# Patient Record
Sex: Female | Born: 1960 | Race: White | Hispanic: No | Marital: Single | Smoking: Never smoker
Health system: Southern US, Community
[De-identification: ages and names within clinical notes are randomized; demographics above are authoritative.]

## PROBLEM LIST (undated history)

## (undated) DIAGNOSIS — Z85820 Personal history of malignant melanoma of skin: Secondary | ICD-10-CM

## (undated) DIAGNOSIS — M1990 Unspecified osteoarthritis, unspecified site: Secondary | ICD-10-CM

## (undated) DIAGNOSIS — K5790 Diverticulosis of intestine, part unspecified, without perforation or abscess without bleeding: Secondary | ICD-10-CM

## (undated) DIAGNOSIS — I341 Nonrheumatic mitral (valve) prolapse: Secondary | ICD-10-CM

## (undated) DIAGNOSIS — K219 Gastro-esophageal reflux disease without esophagitis: Secondary | ICD-10-CM

## (undated) DIAGNOSIS — C801 Malignant (primary) neoplasm, unspecified: Secondary | ICD-10-CM

## (undated) HISTORY — DX: Personal history of malignant melanoma of skin: Z85.820

## (undated) HISTORY — DX: Nonrheumatic mitral (valve) prolapse: I34.1

## (undated) HISTORY — DX: Gastro-esophageal reflux disease without esophagitis: K21.9

## (undated) HISTORY — DX: Unspecified osteoarthritis, unspecified site: M19.90

## (undated) HISTORY — PX: OTHER SURGICAL HISTORY: SHX169

---

## 2004-05-27 ENCOUNTER — Other Ambulatory Visit: Admission: RE | Admit: 2004-05-27 | Discharge: 2004-05-27 | Payer: Self-pay | Admitting: *Deleted

## 2004-07-19 ENCOUNTER — Other Ambulatory Visit: Admission: RE | Admit: 2004-07-19 | Discharge: 2004-07-19 | Payer: Self-pay | Admitting: *Deleted

## 2005-02-04 ENCOUNTER — Encounter (INDEPENDENT_AMBULATORY_CARE_PROVIDER_SITE_OTHER): Payer: Self-pay | Admitting: *Deleted

## 2005-02-04 ENCOUNTER — Observation Stay (HOSPITAL_COMMUNITY): Admission: RE | Admit: 2005-02-04 | Discharge: 2005-02-04 | Payer: Self-pay | Admitting: *Deleted

## 2005-06-12 ENCOUNTER — Other Ambulatory Visit: Admission: RE | Admit: 2005-06-12 | Discharge: 2005-06-12 | Payer: Self-pay | Admitting: *Deleted

## 2005-06-23 ENCOUNTER — Encounter: Admission: RE | Admit: 2005-06-23 | Discharge: 2005-06-23 | Payer: Self-pay | Admitting: *Deleted

## 2006-06-15 ENCOUNTER — Other Ambulatory Visit: Admission: RE | Admit: 2006-06-15 | Discharge: 2006-06-15 | Payer: Self-pay | Admitting: *Deleted

## 2007-06-21 ENCOUNTER — Other Ambulatory Visit: Admission: RE | Admit: 2007-06-21 | Discharge: 2007-06-21 | Payer: Self-pay | Admitting: *Deleted

## 2010-05-16 ENCOUNTER — Ambulatory Visit: Payer: Self-pay | Admitting: Cardiology

## 2010-06-10 ENCOUNTER — Encounter: Payer: Self-pay | Admitting: Cardiology

## 2010-06-11 ENCOUNTER — Encounter: Payer: Self-pay | Admitting: Cardiology

## 2010-06-12 ENCOUNTER — Ambulatory Visit (INDEPENDENT_AMBULATORY_CARE_PROVIDER_SITE_OTHER): Payer: PRIVATE HEALTH INSURANCE | Admitting: Cardiology

## 2010-06-12 ENCOUNTER — Encounter: Payer: Self-pay | Admitting: Cardiology

## 2010-06-12 DIAGNOSIS — F411 Generalized anxiety disorder: Secondary | ICD-10-CM

## 2010-06-12 DIAGNOSIS — I059 Rheumatic mitral valve disease, unspecified: Secondary | ICD-10-CM

## 2010-06-12 DIAGNOSIS — F419 Anxiety disorder, unspecified: Secondary | ICD-10-CM | POA: Insufficient documentation

## 2010-06-12 DIAGNOSIS — I341 Nonrheumatic mitral (valve) prolapse: Secondary | ICD-10-CM

## 2010-06-12 DIAGNOSIS — Z780 Asymptomatic menopausal state: Secondary | ICD-10-CM | POA: Insufficient documentation

## 2010-06-12 DIAGNOSIS — R002 Palpitations: Secondary | ICD-10-CM

## 2010-06-12 NOTE — Assessment & Plan Note (Signed)
The patient has a history of mitral valve prolapse and recently has been having more palpitations.  These can occur at anytime of day or night.  They're not related to exertion.  They seem to be related to stress

## 2010-06-12 NOTE — Progress Notes (Signed)
HPI: This pleasant 50 year old Caucasian woman is seen for a six-month followup office visit.  He has a past history of palpitations and suspected mitral valve prolapse.  Recently she's been having increased palpitations.  She has a new job which was a promotion for her but it is stressful.  She feels that perhaps the job stress may be contributing to her palpitations.  She has not been getting any regular exercise.  She has also been experiencing some left-sided groin pain which manifests itself when she lies supine in bed at night.  She will discuss this symptom with her gynecologist At her visit later this month.  Current Outpatient Prescriptions  Medication Sig Dispense Refill  . ALPRAZolam (XANAX) 0.25 MG tablet Take 0.25 mg by mouth at bedtime as needed. 1/2 TO 1 TABLET PRN      . Ascorbic Acid (VITAMIN C) 500 MG tablet Take 500 mg by mouth daily.        Marland Kitchen aspirin 81 MG tablet Take 81 mg by mouth daily.        . Calcium Carbonate-Vitamin D (CALTRATE 600+D PO) Take by mouth daily.        . Cholecalciferol (VITAMIN D) 2000 UNITS CAPS Take 2,000 Units by mouth daily.        Marland Kitchen estradiol (VIVELLE-DOT) 0.05 MG/24HR Place 1 patch onto the skin once a week.        . Multiple Vitamins-Minerals (CENTRUM PO) Take by mouth daily.          Allergies  Allergen Reactions  . Desyrel (Trazodone Hcl)   . Erythromycin     Patient Active Problem List  Diagnoses  . Mitral valve prolapse  . Palpitations  . Anxiety  . Postmenopausal state    History  Smoking status  . Never Smoker   Smokeless tobacco  . Not on file    History  Alcohol Use     No family history on file.  Review of Systems: The patient denies any heat or cold intolerance.  No weight gain or weight loss.  The patient denies headaches or blurry vision.  There is no cough or sputum production.  The patient denies dizziness.  There is no hematuria or hematochezia.  The patient denies any muscle aches or arthritis.  The patient  denies any rash.  The patient denies frequent falling or instability.  There is no history of depression or anxiety.  All other systems were reviewed and are negative.   Physical Exam: Filed Vitals:   06/12/10 1606  BP: 130/78  Pulse: 78  Her weight is 123, down 7 pounds.  She attributes stress and lack of exercise to the weight loss.  She did visit with the dietitian and is trying to adhere to a heart healthy diet.  The general appearance reveals a pleasant middle-aged woman in no distress.Pupils equal and reactive.   Extraocular Movements are full.  There is no scleral icterus.  The mouth and pharynx are normal.  The neck is supple.  The carotids reveal no bruits.  The jugular venous pressure is normal.  The thyroid is not enlarged.  There is no lymphadenopathy.The chest is clear to percussion and auscultation. There are no rales or rhonchi. Expansion of the chest is symmetrical.The precordium is quiet.  The first heart sound is normal.  The second heart sound is physiologically split.  There is no murmur gallop rub.There is a sharp midsystolic click at apex.  There is no abnormal lift or heave.The abdomen is  soft and nontender. Bowel sounds are normal. The liver and spleen are not enlarged. There Are no abdominal masses. There are no bruits.Normal extremity without phlebitis or edema    Assessment / Plan: Trial of alprazolam 0.25 mg one half or one daily p.r.n. For anxiety and palpitations.  Recheck in 6 months for followup office visit and get fasting lipid panel and chemistries then

## 2010-06-12 NOTE — Assessment & Plan Note (Signed)
Her last echocardiogram was in 2006 and at that time showed normal left ventricular systolic and diastolic function and trace mitral regurgitation but no definite prolapse identified in this patient with a loud midsystolic click.  She had a nuclear stress test for atypical chest pain on 04/23/07 which showed an ejection fraction of 65% and no ischemia and was normal.

## 2010-07-05 ENCOUNTER — Other Ambulatory Visit: Payer: Self-pay | Admitting: Gynecology

## 2010-07-16 ENCOUNTER — Other Ambulatory Visit: Payer: PRIVATE HEALTH INSURANCE | Admitting: *Deleted

## 2010-07-16 ENCOUNTER — Telehealth: Payer: Self-pay | Admitting: Cardiology

## 2010-07-16 DIAGNOSIS — R55 Syncope and collapse: Secondary | ICD-10-CM

## 2010-07-16 NOTE — Telephone Encounter (Signed)
Per mother patient was at work and had syncopal episode and they took her to ED there (not a [facility]).  Emergency department wanted her to have a fasting blood sugar level (hers was over 200) and thyroid panel.  Scheduled for tomorrow.

## 2010-07-16 NOTE — Telephone Encounter (Signed)
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

## 2010-07-17 ENCOUNTER — Other Ambulatory Visit (INDEPENDENT_AMBULATORY_CARE_PROVIDER_SITE_OTHER): Payer: PRIVATE HEALTH INSURANCE | Admitting: *Deleted

## 2010-07-17 DIAGNOSIS — R55 Syncope and collapse: Secondary | ICD-10-CM

## 2010-07-17 LAB — BASIC METABOLIC PANEL
BUN: 16 mg/dL (ref 6–23)
CO2: 29 mEq/L (ref 19–32)
Creatinine, Ser: 0.7 mg/dL (ref 0.4–1.2)
GFR: 100.01 mL/min (ref >60.00)

## 2010-07-24 ENCOUNTER — Telehealth: Payer: Self-pay | Admitting: Cardiology

## 2010-07-24 NOTE — Telephone Encounter (Signed)
Advised labs ok.  Offered follow up appointment, however she will follow up with doctors at work since syncope happened there has to anyway.

## 2010-07-24 NOTE — Telephone Encounter (Signed)
Called returning your phone call. Please call back.

## 2010-07-25 NOTE — Telephone Encounter (Signed)
Agree with plan 

## 2010-07-26 NOTE — H&P (Signed)
NAME:  Catherine Allison, Catherine Allison NO.:  [id number]   MEDICAL RECORD NO.:  [id number]          PATIENT TYPE:  INP   LOCATION:  NA                            FACILITY:  [facility]   PHYSICIAN:  Almedia Balls. Fore, M.D.   DATE OF BIRTH:  21-Jun-1960   DATE OF ADMISSION:  02/04/2005  DATE OF DISCHARGE:                                HISTORY & PHYSICAL   CHIEF COMPLAINT:  Recurrent left ovarian cyst.   HISTORY:  Patient is a 50 year old gravida 0 whose last menstrual period was  January 02, 2005.  She had been followed for a large left ovarian cyst in  the spring of 2006 and underwent hysteroscopy, D&C, and laparoscopy with  left ovarian cystectomy on Jul 19, 2004 with findings of mucinous  cystadenoma as well as benign changes in the endometrium.  She developed  pain in August of 2006 and underwent ultrasound showing recurrence of the  cyst on her left ovary so that she was reultrasounded in early November of  2006.  Consultation with Dr. 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  The patient has been counseled regarding this and the surgery  has been discussed with her fully to include procedure in full, risks of  anesthesia, injury to uterus, tubes, ovaries, bowel, bladder, blood vessels,  ureters, postoperative hemorrhage, infection, recuperation period.  She  fully understands all these considerations and wishes to proceed on February 04, 2005.   PAST MEDICAL HISTORY:  Surgery as noted above.  She has been taking no  medications and is allergic to no medications.   FAMILY HISTORY:  Father with colon cancer and brother with retinal blastoma.  Father with diabetes and cardiovascular disease.   REVIEW OF SYSTEMS:  HEENT:  Negative.  CARDIORESPIRATORY:  History of mitral  valve prolapse without problems.  GASTROINTESTINAL:   Negative.  GENITOURINARY:  As noted above.  NEUROMUSCULAR:  Negative.   PHYSICAL EXAMINATION:  VITAL SIGNS:  Height 5 feet 6-3/4 inches, weight 121  pounds, blood pressure 116/62, pulse 80, respirations 18.  GENERAL:  Well-developed white female in no acute distress.  HEENT:  Within normal limits.  NECK:  Supple without masses, adenopathy, or bruit.  HEART:  Regular rate and rhythm without murmur.  LUNGS:  Clear to P&A.  BREASTS:  Sitting and lying without mass.  Axilla negative.  ABDOMEN:  Soft without mass.  Slight tenderness in the left lower quadrant.  PELVIC:  External genitalia, Bartholin's, urethral, and Skene's glands  within normal limits.  Vagina is clean.  Cervix is slightly inflamed.  Uterus is mid, posterior, irregular, and approximately 10-[redacted] weeks  gestational size.  There is tenderness and slight fullness to the left in  the adnexal area.  Right adnexa within normal limits.  Some tenderness in  the posterior cul-de-sac.  EXTREMITIES:  Within normal limits.  CENTRAL NERVOUS SYSTEM:  Grossly intact.  SKIN:  Without suspicious lesions.  IMPRESSION:  Recurrent left ovarian cyst.   DISPOSITION:  As noted above.  Of note is that a Pap smear was normal in  March of 2006.           ______________________________  Almedia Balls. Randell Patient, M.D.     SRF/MEDQ  D:  01/27/2005  T:  01/27/2005  Job:  [id number]

## 2010-07-26 NOTE — Op Note (Signed)
NAME:  Catherine Allison, Catherine Allison NO.:  [id number]   MEDICAL RECORD NO.:  [id number]          PATIENT TYPE:  INP   LOCATION:  9309                          [facility]ACILITY:  [facility]   PHYSICIAN:  Almedia Balls. Fore, M.D.   DATE OF BIRTH:  1960/05/29   DATE OF PROCEDURE:  02/04/2005  DATE OF DISCHARGE:                                 OPERATIVE REPORT   PREOPERATIVE DIAGNOSIS:  Recurrent left ovarian cyst.   POSTOPERATIVE DIAGNOSIS:  Recurrent left ovarian cyst, pending pathology.   OPERATION:  Laparoscopy with left salpingo-oophorectomy, pelvic washings.   ANESTHESIA:  General orotracheal.   OPERATOR:  Almedia Balls. Randell Patient, M.D.   INDICATION FOR SURGERY:  The patient is a 50 year old who had undergone left  ovarian cystectomy in May 2006 with findings of mucinous cystadenoma and  benign changes.  She has had a recurrence of this cyst on her left ovary,  and after consultation with the GYN oncology group from [facility], it was  felt that she should have a left salpingo-oophorectomy to remove this cyst  and prevent possible malignant change.  She has been fully counseled as to  the need for this procedure and the nature of the procedure to be performed  to include risks of anesthesia and injury to uterus, tubes, ovaries, bowel,  blood vessels, ureters, postoperative hemorrhage, infection, recuperation.  She fully understands all these considerations and has signed informed  consent to proceed on February 04, 2005.   OPERATIVE FINDINGS:  On entry into the abdomen using the laparoscope, the  lower liver edge, gallbladder, spleen, area of the appendix were normal to  visualization.  In the pelvis the right tube and ovary appeared normal with  a corpus luteum present on the right ovary.  The uterus appeared normal.  The left ovary had a cyst present approximately 3-4 cm in greatest  dimensions.   PROCEDURE:  With the patient under general anesthesia, prepped and draped in  the usual sterile  fashion, a speculum was placed in the vagina.  The cervix  was grasped with a single-tooth tenaculum and an acorn cannula was placed as  well.  A Foley catheter was left to straight drainage.  The patient was then  prepared and draped for a laparoscopic procedure.  A incision was made in  the lower pole of the umbilicus with insertion of the Veress cannula and  insufflation of 3 L of carbon dioxide.  A reusable 10/12 trocar for the  operative scope and the scope itself were inserted through the incision.  A  reusable 5 mm probe was inserted through a stab wound just above the  symphysis pubis.  The above-noted findings were visualized.  Bipolar  electrocoagulation was used after the left adnexal structures were retracted  well away from the lateral peritoneal surface to render the  infundibulopelvic ligament and underlying supporting structures to the left  tube and ovary hemostatic.  The utero-ovarian anastomosis was likewise  rendered hemostatic with bipolar electrocoagulation.  These areas were  sequentially cut free with excision of the left tube and ovary from the left  cornual area and left peritoneal surface area.  An EndoCatch bag was then  placed after extending the lower abdominal incision and placing an 11 mm  disposable trocar through this site.  The left tube and ovary were placed in  the bag and brought up through the lower abdominal incision after extending  this somewhat.  The ovary and tube were removed intact.  The fascia was then  reapproximated with continuous suture of 0 Vicryl.  After noting that  hemostasis had been maintained in the surgical site and that sponge and  instrument counts were correct, the instruments were removed from the  peritoneal cavity and gas was allowed to fully escape.  The incisions were  closed with the above-noted fascial suture and a subcuticular suture of 3-0  plain catgut.  Estimated blood loss less than 25 mL.  The patient was taken  to  the recovery room in good condition with clear urine in the Foley  catheter tubing.   FOLLOW-UP CARE:  She will be discharged following recovery if vital signs  are stable and sent home.  She was asked to return to the office in two  weeks for follow-up and to call if heavy bleeding, pain or unexplained fever  should ensure.  She was given a prescription for Darvocet-N 100 generic #20  to be taken one-half to two q.6h. p.r.n. pain, and Macrobid #8 to be taken  two stat and one t.i.d.           ______________________________  Almedia Balls. Randell Patient, M.D.     SRF/MEDQ  D:  02/04/2005  T:  02/04/2005  Job:  [id number]

## 2010-08-14 ENCOUNTER — Telehealth: Payer: Self-pay | Admitting: Cardiology

## 2010-08-14 ENCOUNTER — Ambulatory Visit (INDEPENDENT_AMBULATORY_CARE_PROVIDER_SITE_OTHER): Payer: PRIVATE HEALTH INSURANCE | Admitting: Cardiology

## 2010-08-14 ENCOUNTER — Encounter: Payer: Self-pay | Admitting: Cardiology

## 2010-08-14 DIAGNOSIS — R55 Syncope and collapse: Secondary | ICD-10-CM

## 2010-08-14 DIAGNOSIS — R42 Dizziness and giddiness: Secondary | ICD-10-CM

## 2010-08-14 LAB — CBC WITH DIFFERENTIAL/PLATELET
Basophils Relative: 0.5 % (ref 0.0–3.0)
Eosinophils Absolute: 0.1 10*3/uL (ref 0.0–0.7)
Lymphs Abs: 1.2 10*3/uL (ref 0.7–4.0)
MCHC: 34.5 g/dL (ref 30.0–36.0)
MCV: 91.6 fl (ref 78.0–100.0)
Monocytes Absolute: 0.5 10*3/uL (ref 0.1–1.0)
Monocytes Relative: 5.4 % (ref 3.0–12.0)
RDW: 13 % (ref 11.5–14.6)

## 2010-08-14 LAB — HEPATIC FUNCTION PANEL
ALT: 16 U/L (ref 0–35)
Albumin: 4.4 g/dL (ref 3.5–5.2)
Alkaline Phosphatase: 46 U/L (ref 39–117)
Bilirubin, Direct: 0.1 mg/dL (ref 0.0–0.3)
Total Protein: 6.9 g/dL (ref 6.0–8.3)

## 2010-08-14 LAB — BASIC METABOLIC PANEL
BUN: 14 mg/dL (ref 6–23)
Creatinine, Ser: 0.5 mg/dL (ref 0.4–1.2)
Glucose, Bld: 86 mg/dL (ref 70–99)

## 2010-08-14 NOTE — Progress Notes (Signed)
Catherine Allison Date of Birth:  [birth year] [facility] [address]   [address] [place], [place]  [address] [phone]           Fax   [phone]  History of Present Illness: This pleasant 50 year old woman is seen for a evaluation of syncope.  She has had one episode of frank syncope occurring without warning in 2 subsequent episodes of severe dizziness.  She does not have any history of previous similar problems.  She does have a history of suspected mitral valve prolapse although her echocardiogram in March of 2006 showed trace mitral regurgitation but no definite prolapse.  The patient had a nuclear stress test 04/23/07 and exercise well on the Bruce protocol and had no evidence of ischemia and her ejection fraction was 65%.  Current Outpatient Prescriptions  Medication Sig Dispense Refill  . ALPRAZolam (XANAX) 0.25 MG tablet Take 0.25 mg by mouth at bedtime as needed. 1/2 TO 1 TABLET PRN      . Ascorbic Acid (VITAMIN C) 500 MG tablet Take 500 mg by mouth daily.        Marland Kitchen aspirin 81 MG tablet Take 81 mg by mouth daily.        . Calcium Carbonate-Vitamin D (CALTRATE 600+D PO) Take by mouth daily.        . Cholecalciferol (VITAMIN D) 2000 UNITS CAPS Take 2,000 Units by mouth daily.        Marland Kitchen estradiol (VIVELLE-DOT) 0.05 MG/24HR Place 1 patch onto the skin once a week.        . Multiple Vitamins-Minerals (CENTRUM PO) Take by mouth daily.          Allergies  Allergen Reactions  . Desyrel (Trazodone Hcl)   . Erythromycin     Patient Active Problem List  Diagnoses  . Mitral valve prolapse  . Palpitations  . Anxiety  . Postmenopausal state  . Syncope    History  Smoking status  . Never Smoker   Smokeless tobacco  . Not on file    History  Alcohol Use     No family history on file.  Review of Systems: Constitutional: no fever chills diaphoresis or fatigue or change in weight.  Head and neck: no hearing loss, no epistaxis, no photophobia or  visual disturbance. Respiratory: No cough, shortness of breath or wheezing. Cardiovascular: No chest pain peripheral edema, palpitations. Gastrointestinal: No abdominal distention, no abdominal pain, no change in bowel habits hematochezia or melena. Genitourinary: No dysuria, no frequency, no urgency, no nocturia. Musculoskeletal:No arthralgias, no back pain, no gait disturbance or myalgias. Neurological: No dizziness, no headaches, no numbness, no seizures, no syncope, no weakness, no tremors. Hematologic: No lymphadenopathy, no easy bruising. Psychiatric: No confusion, no hallucinations, no sleep disturbance.    Physical Exam: Filed Vitals:   08/14/10 1102  BP: 110/60  Pulse: 64  The general appearance reveals a thin middle-aged woman in no distress.Pupils equal and reactive.   Extraocular Movements are full.  There is no scleral icterus.  The mouth and pharynx are normal.  The neck is supple.  The carotids reveal no bruits.  The jugular venous pressure is normal.  The thyroid is not enlarged.  There is no lymphadenopathy.The chest is clear to percussion and auscultation. There are no rales or rhonchi. Expansion of the chest is symmetrical.  Heart reveals midsystolic click.The abdomen is soft and nontender. Bowel sounds are normal. The liver and spleen are not enlarged. There Are no abdominal  masses. There are no bruits.The pedal pulses are good.  There is no phlebitis or edema.  There is no cyanosis or clubbing.Strength is normal and symmetrical in all extremities.  There is no lateralizing weakness.  There are no sensory deficits.The skin is warm and dry.  There is no rash.  She does not show any orthostatic drop in blood pressure when standing.  EKG today shows normal sinus rhythm and normal QT interval and is within normal limits  Assessment / Plan: Syncope of uncertain etiology.  We will get baseline blood work, up date her two-dimensional echocardiogram, and have her wear a event  monitor.  This may prove to be dizziness secondary to vertigo.  She will get some over-the-counter Dramamine.  I've also encouraged her to increase her dietary salt intake to maintain a slightly higher blood pressure to avoid orthostatic symptoms.  Keep regular appointment in August for followup.

## 2010-08-14 NOTE — Assessment & Plan Note (Signed)
This pleasant 50 year old woman is seen for evaluation of syncope.  Several weeks ago she was at work and had syncope without warning.  She woke up on the floor with her coworkers gathered around her.  They took her to employee health at [facility] where she was checked and they could find nothing and no further testing was done.  She did not have a CT scan of the head.  Since then she has had 2 further episodes of severe dizziness without frank syncope.  She had one episode last evening associated with nausea.  There was no sense of the room spinning but she was extremely dizzy and had to crawl from her chair to get to her bed.  She has a past history of palpitations and suspected mitral valve prolapse.  She has a tendency toward low blood pressure.  Recently she has been cutting back on her salt intake.

## 2010-08-16 ENCOUNTER — Telehealth: Payer: Self-pay | Admitting: *Deleted

## 2010-08-16 NOTE — Telephone Encounter (Signed)
Advised mother of lab results  

## 2010-08-16 NOTE — Progress Notes (Signed)
Advised mother

## 2010-08-16 NOTE — Telephone Encounter (Signed)
Message copied by Burnell Blanks on Fri Aug 16, 2010 10:30 AM ------      Message from: Cassell Clement      Created: Wed Aug 14, 2010  5:02 PM       The blood work is normal.  Please report.

## 2010-08-16 NOTE — Progress Notes (Signed)
Left message

## 2010-08-21 ENCOUNTER — Ambulatory Visit (HOSPITAL_COMMUNITY): Payer: PRIVATE HEALTH INSURANCE | Attending: Cardiology | Admitting: Radiology

## 2010-08-21 ENCOUNTER — Telehealth: Payer: Self-pay | Admitting: *Deleted

## 2010-08-21 ENCOUNTER — Telehealth: Payer: Self-pay | Admitting: Cardiology

## 2010-08-21 DIAGNOSIS — I059 Rheumatic mitral valve disease, unspecified: Secondary | ICD-10-CM | POA: Insufficient documentation

## 2010-08-21 DIAGNOSIS — R42 Dizziness and giddiness: Secondary | ICD-10-CM | POA: Insufficient documentation

## 2010-08-21 DIAGNOSIS — R002 Palpitations: Secondary | ICD-10-CM | POA: Insufficient documentation

## 2010-08-21 DIAGNOSIS — R55 Syncope and collapse: Secondary | ICD-10-CM

## 2010-08-21 NOTE — Progress Notes (Signed)
Advised 

## 2010-08-21 NOTE — Telephone Encounter (Signed)
Message copied by Karle Plumber on Wed Aug 21, 2010  2:50 PM ------      Message from: Cassell Clement      Created: Wed Aug 21, 2010  1:03 PM       Please report.  The echocardiogram is good.The left ventricle is normal.  Mitral valve shows only trivial mitral regurgitation.  No cause for her syncopal episodes was seen on echo

## 2010-08-21 NOTE — Telephone Encounter (Signed)
Left call back number with pt mother. Pt is currently at work. And will call us back regarding echo results.

## 2010-08-21 NOTE — Telephone Encounter (Signed)
Message copied by Burnell Blanks on Wed Aug 21, 2010  3:08 PM ------      Message from: Cassell Clement      Created: Wed Aug 21, 2010  1:03 PM       Please report.  The echocardiogram is good.The left ventricle is normal.  Mitral valve shows only trivial mitral regurgitation.  No cause for her syncopal episodes was seen on echo

## 2010-08-21 NOTE — Telephone Encounter (Signed)
Advised of echo results 

## 2010-08-21 NOTE — Telephone Encounter (Signed)
Message copied by Burnell Blanks on Wed Aug 21, 2010  3:07 PM ------      Message from: Cassell Clement      Created: Wed Aug 21, 2010  1:03 PM       Please report.  The echocardiogram is good.The left ventricle is normal.  Mitral valve shows only trivial mitral regurgitation.  No cause for her syncopal episodes was seen on echo

## 2010-09-12 DIAGNOSIS — R55 Syncope and collapse: Secondary | ICD-10-CM

## 2010-09-26 ENCOUNTER — Encounter: Payer: Self-pay | Admitting: Cardiology

## 2010-09-30 ENCOUNTER — Encounter: Payer: Self-pay | Admitting: Cardiology

## 2010-10-08 NOTE — Telephone Encounter (Signed)
No note necessary for this encounter. °

## 2010-10-17 ENCOUNTER — Encounter: Payer: Self-pay | Admitting: Cardiology

## 2010-10-18 ENCOUNTER — Encounter: Payer: Self-pay | Admitting: Cardiology

## 2010-10-18 ENCOUNTER — Ambulatory Visit (INDEPENDENT_AMBULATORY_CARE_PROVIDER_SITE_OTHER): Payer: PRIVATE HEALTH INSURANCE | Admitting: Cardiology

## 2010-10-18 DIAGNOSIS — I059 Rheumatic mitral valve disease, unspecified: Secondary | ICD-10-CM

## 2010-10-18 DIAGNOSIS — F411 Generalized anxiety disorder: Secondary | ICD-10-CM

## 2010-10-18 DIAGNOSIS — I341 Nonrheumatic mitral (valve) prolapse: Secondary | ICD-10-CM

## 2010-10-18 DIAGNOSIS — F419 Anxiety disorder, unspecified: Secondary | ICD-10-CM

## 2010-10-18 NOTE — Assessment & Plan Note (Signed)
Patient does admit to some job-related stress even though she enjoys her work.  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  The job has a lot of responsibility and she takes her job very seriously.

## 2010-10-18 NOTE — Assessment & Plan Note (Signed)
The patient has not had any recurrent chest pain or other symptoms related to her mitral valve prolapse.

## 2010-10-18 NOTE — Progress Notes (Signed)
Catherine Allison Date of Birth:  07/11/1960 [facility] / [facility] [address]   [address] [place], [place]  [address] [phone]           Fax   [phone]  HPI: This pleasant 50 year old woman is seen for a followup office visit.  He has a past history of mitral valve prolapse.  She has not been expressing any chest pain or shortness of breath.  She does not have any history of ischemic heart disease.  She had a normal nuclear stress testing 04/23/07.  Her last echocardiogram was 05/17/04 which showed trace mitral regurgitation with no definite mitral valve prolapse.  During this past year she had an episode of syncope while at work.  This was evaluated withAnd updated two-dimensional echocardiogram on 08/21/10 which was unremarkable.  She also had an event monitor which did not show any significant arrhythmias she's had no recurrence of the syncope.  Current Outpatient Prescriptions  Medication Sig Dispense Refill  . ALPRAZolam (XANAX) 0.25 MG tablet Take 0.25 mg by mouth at bedtime as needed. 1/2 TO 1 TABLET PRN      . Ascorbic Acid (VITAMIN C) 500 MG tablet Take 500 mg by mouth daily.        Marland Kitchen aspirin 81 MG tablet Take 81 mg by mouth daily.        . Calcium Carbonate-Vitamin D (CALTRATE 600+D PO) Take by mouth daily.        . Cholecalciferol (VITAMIN D) 2000 UNITS CAPS Take 2,000 Units by mouth daily.        Marland Kitchen estradiol (VIVELLE-DOT) 0.05 MG/24HR Place 1 patch onto the skin once a week.        . Multiple Vitamins-Minerals (CENTRUM PO) Take by mouth daily.        . Tacrolimus (PROGRAF PO) Take by mouth daily.          Allergies  Allergen Reactions  . Desyrel (Trazodone Hcl)   . Erythromycin     Patient Active Problem List  Diagnoses  . Mitral valve prolapse  . Palpitations  . Anxiety  . Postmenopausal state  . Syncope    History  Smoking status  . Never Smoker   Smokeless tobacco  . Not on file    History  Alcohol Use     No family history on  file.  Review of Systems: The patient denies any heat or cold intolerance.  No weight gain or weight loss.  The patient denies headaches or blurry vision.  There is no cough or sputum production.  The patient denies dizziness.  There is no hematuria or hematochezia.  The patient denies any muscle aches or arthritis.  The patient denies any rash.  The patient denies frequent falling or instability.  There is no history of depression or anxiety.  All other systems were reviewed and are negative.   Physical Exam: Filed Vitals:   10/18/10 1558  BP: 120/80  Pulse: 68  The general appearance feels a well-developed thin woman in no acute distress thePupils equal and reactive.   Extraocular Movements are full.  There is no scleral icterus.  The mouth and pharynx are normal.  The neck is supple.  The carotids reveal no bruits.  The jugular venous pressure is normal.  The thyroid is not enlarged.  There is no lymphadenopathy.  The chest is clear to percussion and auscultation. There are no rales or rhonchi. Expansion of the chest is symmetrical.   heart reveals a  soft midsystolic click.The abdomen is soft and nontender. Bowel sounds are normal. The liver and spleen are not enlarged. There Are no abdominal masses. There are no bruits.    Normal extremityStrength is normal and symmetrical in all extremities.  There is no lateralizing weakness.  There are no sensory deficits.  The skin is warm and dry.  There is no rash.      Assessment / Plan: Continue same medication and be rechecked in 6 months for a followup office visit.

## 2011-05-11 ENCOUNTER — Telehealth: Payer: Self-pay | Admitting: Physician Assistant

## 2011-05-11 NOTE — Telephone Encounter (Signed)
Pt called requesting antibiotic for possible L eye infection. She has h/o prior R eye stye, which was treated by Dr Patty Sermons. I indicated to her that I was not comfortable calling in a prescription for her, and that she should be seen and evaluated. I suggested a walk in clinic today. In the meanwhile, I recommended she use warm compresses. She understood my reluctance to treat her with an antibiotic for a non cardiac issue, and appreciated the return call.

## 2011-07-07 ENCOUNTER — Other Ambulatory Visit: Payer: Self-pay | Admitting: Gynecology

## 2011-07-22 ENCOUNTER — Ambulatory Visit (INDEPENDENT_AMBULATORY_CARE_PROVIDER_SITE_OTHER): Payer: PRIVATE HEALTH INSURANCE | Admitting: Cardiology

## 2011-07-22 ENCOUNTER — Encounter: Payer: Self-pay | Admitting: Cardiology

## 2011-07-22 VITALS — BP 127/74 | HR 74 | Ht 66.0 in | Wt 123.0 lb

## 2011-07-22 DIAGNOSIS — I341 Nonrheumatic mitral (valve) prolapse: Secondary | ICD-10-CM

## 2011-07-22 DIAGNOSIS — F411 Generalized anxiety disorder: Secondary | ICD-10-CM

## 2011-07-22 DIAGNOSIS — R55 Syncope and collapse: Secondary | ICD-10-CM

## 2011-07-22 DIAGNOSIS — I059 Rheumatic mitral valve disease, unspecified: Secondary | ICD-10-CM

## 2011-07-22 DIAGNOSIS — F419 Anxiety disorder, unspecified: Secondary | ICD-10-CM

## 2011-07-22 MED ORDER — ALPRAZOLAM 0.25 MG PO TABS: 0.2500 mg | ORAL_TABLET | Freq: Every evening | ORAL | Status: AC | PRN

## 2011-07-22 NOTE — Assessment & Plan Note (Signed)
She has had no further syncopal episodes.  She relates that at a recent dental office her blood pressure was in the range of 95 systolic.  She is not on any medications which would be lowering her blood pressure.  She will need to try to boost her blood pressure by assuring adequate intake of water and salt

## 2011-07-22 NOTE — Progress Notes (Signed)
Catherine Allison Date of Birth:  [birth year] [facility] [address] [place], [place]  [address] [phone]  Fax   [phone]  HPI: This pleasant 51 year old woman is seen for a six-month followup office visit.  She has a past history of mitral valve prolapse.  She also has a past history of syncope.  She has not had any syncopal episodes in almost a year.  He does have a history of suspected mitral valve prolapse although her echocardiogram in 2006 showed only trace mitral regurgitation but no definite prolapse.  The patient does not have any history of ischemic heart disease.  She had a normal nuclear stress test 04/23/07 on the Bruce protocol had no evidence of ischemia and her ejection fraction was 65%  Current Outpatient Prescriptions  Medication Sig Dispense Refill  . Ascorbic Acid (VITAMIN C) 500 MG tablet Take 500 mg by mouth daily.        Marland Kitchen aspirin 81 MG tablet Take 81 mg by mouth daily.        . Calcium Carbonate-Vitamin D (CALTRATE 600+D PO) Take by mouth daily.        . Cholecalciferol (VITAMIN D) 2000 UNITS CAPS Take 2,000 Units by mouth daily.        Marland Kitchen DOXYCYCLINE, ROSACEA, PO Take by mouth daily.      Marland Kitchen estradiol (VIVELLE-DOT) 0.05 MG/24HR Place 1 patch onto the skin once a week.        . Multiple Vitamins-Minerals (CENTRUM PO) Take by mouth daily.        . Tacrolimus (PROGRAF PO) Take by mouth daily.          Allergies  Allergen Reactions  . Desyrel (Trazodone Hcl)   . Erythromycin     Patient Active Problem List  Diagnoses  . Mitral valve prolapse  . Palpitations  . Anxiety  . Postmenopausal state  . Syncope    History  Smoking status  . Never Smoker   Smokeless tobacco  . Not on file    History  Alcohol Use     History reviewed. No pertinent family history.  Review of Systems: The patient denies any heat or cold intolerance.  No weight gain or weight loss.  The patient denies headaches or blurry vision.  There is no cough  or sputum production.  The patient denies dizziness.  There is no hematuria or hematochezia.  The patient denies any muscle aches or arthritis.  The patient denies any rash.  The patient denies frequent falling or instability.  There is no history of depression or anxiety.  All other systems were reviewed and are negative.   Physical Exam: Filed Vitals:   07/22/11 1559  BP: 127/74  Pulse: 74   the general appearance reveals a well-developed well-nourished woman in no distress.The head and neck exam reveals pupils equal and reactive.  Extraocular movements are full.  There is no scleral icterus.  The mouth and pharynx are normal.  The neck is supple.  The carotids reveal no bruits.  The jugular venous pressure is normal.  The  thyroid is not enlarged.  There is no lymphadenopathy.  The chest is clear to percussion and auscultation.  There are no rales or rhonchi.  Expansion of the chest is symmetrical.  The precordium is quiet.  The first heart sound is normal.  The second heart sound is physiologically split.  There is no murmur gallop rub.  There is a faint apical click  There is no  abnormal lift or heave.  The abdomen is soft and nontender.  The bowel sounds are normal.  The liver and spleen are not enlarged.  There are no abdominal masses.  There are no abdominal bruits.  Extremities reveal good pedal pulses.  There is no phlebitis or edema.  There is no cyanosis or clubbing.  Strength is normal and symmetrical in all extremities.  There is no lateralizing weakness.  There are no sensory deficits.  The skin is warm and dry.  There is no rash.      Assessment / Plan: Continue same medication.  Be sure to drink plenty of water to avoid hypotension and also eat adequate amounts salt. Recheck in 6 months for followup office visit and EKG

## 2011-07-22 NOTE — Patient Instructions (Signed)
Your physician recommends that you continue on your current medications as directed. Please refer to the Current Medication list given to you today.  Your physician wants you to follow-up in: 6 months. You will receive a reminder letter in the mail two months in advance. If you don't receive a letter, please call our office to schedule the follow-up appointment.  

## 2011-07-22 NOTE — Assessment & Plan Note (Signed)
Patient has occasional episodes of anxiety and takes a very infrequent Xanax 0.25 mg tablet

## 2011-07-22 NOTE — Assessment & Plan Note (Signed)
No recent palpitations.  No further syncope.  No symptoms of exertional chest pain or dyspnea

## 2012-01-19 ENCOUNTER — Telehealth: Payer: Self-pay | Admitting: Cardiology

## 2012-01-19 ENCOUNTER — Encounter: Payer: Self-pay | Admitting: Cardiology

## 2012-01-19 ENCOUNTER — Ambulatory Visit (INDEPENDENT_AMBULATORY_CARE_PROVIDER_SITE_OTHER): Payer: PRIVATE HEALTH INSURANCE | Admitting: Cardiology

## 2012-01-19 VITALS — BP 129/68 | HR 79 | Resp 18 | Ht 67.0 in | Wt 123.0 lb

## 2012-01-19 DIAGNOSIS — Z780 Asymptomatic menopausal state: Secondary | ICD-10-CM

## 2012-01-19 DIAGNOSIS — I059 Rheumatic mitral valve disease, unspecified: Secondary | ICD-10-CM

## 2012-01-19 DIAGNOSIS — F411 Generalized anxiety disorder: Secondary | ICD-10-CM

## 2012-01-19 DIAGNOSIS — R5381 Other malaise: Secondary | ICD-10-CM

## 2012-01-19 DIAGNOSIS — R55 Syncope and collapse: Secondary | ICD-10-CM

## 2012-01-19 DIAGNOSIS — I341 Nonrheumatic mitral (valve) prolapse: Secondary | ICD-10-CM

## 2012-01-19 DIAGNOSIS — F419 Anxiety disorder, unspecified: Secondary | ICD-10-CM

## 2012-01-19 DIAGNOSIS — R5383 Other fatigue: Secondary | ICD-10-CM

## 2012-01-19 DIAGNOSIS — R42 Dizziness and giddiness: Secondary | ICD-10-CM

## 2012-01-19 NOTE — Assessment & Plan Note (Signed)
The patient has not been expressing any chest pain from her mitral valve prolapse.

## 2012-01-19 NOTE — Telephone Encounter (Signed)
New problem:   Mother calling . C/O chest pain  & rapid heart beat , dizziness . Nitro tab was taken. B/p 96/63. Not at home today. Went to work today. Mother wants to know can she be seen today .

## 2012-01-19 NOTE — Telephone Encounter (Signed)
Scheduled ov for this afternoon

## 2012-01-19 NOTE — Patient Instructions (Addendum)
Will obtain labs today and call you with the results ([id number])  Your physician recommends that you continue on your current medications as directed. Please refer to the Current Medication list given to you today.  Your physician wants you to follow-up in: 6 months  You will receive a reminder letter in the mail two months in advance. If you don't receive a letter, please call our office to schedule the follow-up appointment.   If symptoms worse or no better call back or go to the emergency room

## 2012-01-19 NOTE — Assessment & Plan Note (Signed)
The patient appears to be more anxious.  She has alprazolam on hand but has not been taking any recently.  Her episodes of awakening in the middle of the night with dizziness and tachypnea suggestive of anxiety.  She has not had any symptoms to suggest acute neuronitis or vertigo.

## 2012-01-19 NOTE — Assessment & Plan Note (Signed)
The patient has had no further episodes of syncope. 

## 2012-01-19 NOTE — Assessment & Plan Note (Signed)
Patient complains of malaise and fatigue.  She's had some trouble with her memory.  She also has had some intermittent loss of eyelashes and eyebrows.  We will check thyroid function.

## 2012-01-19 NOTE — Progress Notes (Signed)
Catherine Allison Date of Birth:  Sep 30, 1960 [facility] [address] [place], [place]  [address] [phone]  Fax   [phone]  HPI: This pleasant 51 year old woman is seen for a six-month followup office visit. She has a past history of mitral valve prolapse. She also has a past history of syncope. She has not had any syncopal episodes in almost a year. He does have a history of suspected mitral valve prolapse although her echocardiogram in 2006 showed only trace mitral regurgitation but no definite prolapse. The patient does not have any history of ischemic heart disease. She had a normal nuclear stress test 04/23/07 on the Bruce protocol had no evidence of ischemia and her ejection fraction was 65%.  Recently she has been awakening in the middle of the night with dizzy spells and probable hyperventilation.   Current Outpatient Prescriptions  Medication Sig Dispense Refill  . Ascorbic Acid (VITAMIN C) 500 MG tablet Take 500 mg by mouth daily.        Marland Kitchen aspirin 81 MG tablet Take 81 mg by mouth daily.        . Cholecalciferol (VITAMIN D) 2000 UNITS CAPS Take 2,000 Units by mouth daily.        Marland Kitchen estradiol (VIVELLE-DOT) 0.05 MG/24HR Place 1 patch onto the skin once a week.        . Multiple Vitamins-Minerals (CENTRUM PO) Take by mouth daily.        Marland Kitchen ALPRAZolam (XANAX) 0.25 MG tablet Take 1 tablet (0.25 mg total) by mouth at bedtime as needed. 1/2 TO 1 TABLET PRN  30 tablet  3  . Calcium Carbonate-Vitamin D (CALTRATE 600+D PO) Take by mouth daily.        Marland Kitchen DOXYCYCLINE, ROSACEA, PO Take by mouth daily.      . Tacrolimus (PROGRAF PO) Take by mouth daily.          Allergies  Allergen Reactions  . Desyrel (Trazodone Hcl)   . Erythromycin     Patient Active Problem List  Diagnosis  . Mitral valve prolapse  . Palpitations  . Anxiety  . Postmenopausal state  . Syncope    History  Smoking status  . Never Smoker   Smokeless tobacco  . Not on file    History   Alcohol Use     No family history on file.  Review of Systems: The patient denies any heat or cold intolerance.  No weight gain or weight loss.  The patient denies headaches or blurry vision.  There is no cough or sputum production.  The patient denies dizziness.  There is no hematuria or hematochezia.  The patient denies any muscle aches or arthritis.  The patient denies any rash.  The patient denies frequent falling or instability.  There is no history of depression or anxiety.  All other systems were reviewed and are negative.   Physical Exam: Filed Vitals:   01/19/12 1529  BP: 129/68  Pulse: 79  Resp: 18   the general appearance reveals a well-developed well-nourished thin woman in no acute distress.The head and neck exam reveals pupils equal and reactive.  Extraocular movements are full.  There is no scleral icterus.  The mouth and pharynx are normal.  The neck is supple.  The carotids reveal no bruits.  The jugular venous pressure is normal.  The  thyroid is not enlarged.  There is no lymphadenopathy.  The chest is clear to percussion and auscultation.  There are no rales or  rhonchi.  Expansion of the chest is symmetrical.  The precordium is quiet.  The first heart sound is normal.  The second heart sound is physiologically split.  There is no murmur.  There is a faint midsystolic click.  There is no abnormal lift or heave.  The abdomen is soft and nontender.  The bowel sounds are normal.  The liver and spleen are not enlarged.  There are no abdominal masses.  There are no abdominal bruits.  Extremities reveal good pedal pulses.  There is no phlebitis or edema.  There is no cyanosis or clubbing.  Strength is normal and symmetrical in all extremities.  There is no lateralizing weakness.  There are no sensory deficits.  The skin is warm and dry.  There is no rash.  EKG today shows normal sinus rhythm and poor R-wave progression consistent with mild pectus deformity.  There are no ischemic  changes and no arrhythmias   Assessment / Plan: We are checking thyroid function and CBC today.  I suspect that her symptoms are related to some situational anxiety.  She has alprazolam on hand which she can use on a when necessary basis.  Patient was reassured.  Recheck 6 months for followup office visit or sooner when necessary

## 2012-01-20 ENCOUNTER — Telehealth: Payer: Self-pay | Admitting: *Deleted

## 2012-01-20 LAB — CBC WITH DIFFERENTIAL/PLATELET
Basophils Absolute: 0 10*3/uL (ref 0.0–0.1)
Eosinophils Absolute: 0.2 10*3/uL (ref 0.0–0.7)
Hemoglobin: 13.1 g/dL (ref 12.0–15.0)
Lymphocytes Relative: 15.6 % (ref 12.0–46.0)
Monocytes Relative: 5.9 % (ref 3.0–12.0)
Neutro Abs: 6.7 10*3/uL (ref 1.4–7.7)
Neutrophils Relative %: 76.1 % (ref 43.0–77.0)
RDW: 13.4 % (ref 11.5–14.6)

## 2012-01-20 LAB — T4, FREE: Free T4: 0.81 ng/dL (ref 0.60–1.60)

## 2012-01-20 NOTE — Telephone Encounter (Signed)
Advised patient of lab results  

## 2012-01-20 NOTE — Telephone Encounter (Signed)
Message copied by Burnell Blanks on Tue Jan 20, 2012  3:47 PM ------      Message from: Cassell Clement      Created: Tue Jan 20, 2012 12:46 PM       Please report to patient.  The recent labs are stable. Continue same medication and careful diet.

## 2012-01-20 NOTE — Progress Notes (Signed)
Quick Note:  Please report to patient. The recent labs are stable. Continue same medication and careful diet. ______ 

## 2012-12-01 ENCOUNTER — Ambulatory Visit: Payer: PRIVATE HEALTH INSURANCE | Admitting: Cardiology

## 2012-12-08 ENCOUNTER — Encounter: Payer: Self-pay | Admitting: Cardiology

## 2012-12-08 ENCOUNTER — Ambulatory Visit (INDEPENDENT_AMBULATORY_CARE_PROVIDER_SITE_OTHER): Payer: PRIVATE HEALTH INSURANCE | Admitting: Cardiology

## 2012-12-08 VITALS — BP 118/82 | HR 64 | Ht 66.0 in | Wt 123.0 lb

## 2012-12-08 DIAGNOSIS — R002 Palpitations: Secondary | ICD-10-CM

## 2012-12-08 DIAGNOSIS — I059 Rheumatic mitral valve disease, unspecified: Secondary | ICD-10-CM

## 2012-12-08 DIAGNOSIS — I341 Nonrheumatic mitral (valve) prolapse: Secondary | ICD-10-CM

## 2012-12-08 DIAGNOSIS — M1990 Unspecified osteoarthritis, unspecified site: Secondary | ICD-10-CM | POA: Insufficient documentation

## 2012-12-08 DIAGNOSIS — R42 Dizziness and giddiness: Secondary | ICD-10-CM

## 2012-12-08 NOTE — Assessment & Plan Note (Signed)
The patient has expressing some stiffness and discomfort in both hands in the morning.  The symptoms improve as the day goes on.  She also has some component of mild Raynaud's phenomenon involving her hands and she has cold sensitivity.  She has had problems with low blood pressure chronically.

## 2012-12-08 NOTE — Progress Notes (Signed)
Catherine Allison Date of Birth:  [birth year] [facility] [address] [place], [place]  [address] [phone]  Fax   [phone]  HPI: This pleasant 52 year old woman is seen for a six-month followup office visit. She has a past history of mitral valve prolapse. She also has a past history of syncope. She has not had any syncopal episodes in more than one year. He does have a history of suspected mitral valve prolapse although her echocardiogram in 2006 showed only trace mitral regurgitation but no definite prolapse. The patient does not have any history of ischemic heart disease. She had a normal nuclear stress test 04/23/07 on the Bruce protocol had no evidence of ischemia and her ejection fraction was 65%.  Recently she has been having some problems with her heart racing at night.  She has seen a neurologist at [facility] who has recommended a [employer] monitor which will be done there. Since we last saw the patient she has become engaged to be married.   Current Outpatient Prescriptions  Medication Sig Dispense Refill  . Ascorbic Acid (VITAMIN C) 500 MG tablet Take 500 mg by mouth daily.        Marland Kitchen aspirin 81 MG tablet Take 81 mg by mouth daily.        . Cholecalciferol (VITAMIN D) 2000 UNITS CAPS Take 5,000 Units by mouth daily.       . Multiple Vitamins-Minerals (CENTRUM PO) Take by mouth daily.        . Tacrolimus (PROGRAF PO) Take by mouth daily. PRN       No current facility-administered medications for this visit.    Allergies  Allergen Reactions  . Desyrel [Trazodone Hcl]   . Erythromycin     Patient Active Problem List   Diagnosis Date Noted  . Syncope 08/14/2010  . Mitral valve prolapse 06/12/2010  . Palpitations 06/12/2010  . Anxiety 06/12/2010  . Postmenopausal state 06/12/2010    History  Smoking status  . Never Smoker   Smokeless tobacco  . Not on file    History  Alcohol Use     No family history on file.  Review of  Systems: The patient denies any heat or cold intolerance.  No weight gain or weight loss.  The patient denies headaches or blurry vision.  There is no cough or sputum production.  The patient denies dizziness.  There is no hematuria or hematochezia.  The patient denies any muscle aches or arthritis.  The patient denies any rash.  The patient denies frequent falling or instability.  There is no history of depression or anxiety.  All other systems were reviewed and are negative.   Physical Exam: Filed Vitals:   12/08/12 1600  BP: 118/82  Pulse: 64   the general appearance reveals a well-developed well-nourished thin woman in no acute distress.The head and neck exam reveals pupils equal and reactive.  Extraocular movements are full.  There is no scleral icterus.  The mouth and pharynx are normal.  The neck is supple.  The carotids reveal no bruits.  The jugular venous pressure is normal.  The  thyroid is not enlarged.  There is no lymphadenopathy.  The chest is clear to percussion and auscultation.  There are no rales or rhonchi.  Expansion of the chest is symmetrical.  The precordium is quiet.  The first heart sound is normal.  The second heart sound is physiologically split.  There is no murmur.  There is a  faint midsystolic click.  There is no abnormal lift or heave.  The abdomen is soft and nontender.  The bowel sounds are normal.  The liver and spleen are not enlarged.  There are no abdominal masses.  There are no abdominal bruits.  Extremities reveal good pedal pulses.  There is no phlebitis or edema.  There is no cyanosis or clubbing.  Strength is normal and symmetrical in all extremities.  There is no lateralizing weakness.  There are no sensory deficits.  The skin is warm and dry.  There is no rash.     Assessment / Plan: No new medication was prescribed.  I do want her to continue to drink plenty of salt and water to help support her blood pressure.  Recheck in 6 months for followup office  visit and EKG.  We congratulated her on her engagement today.

## 2012-12-08 NOTE — Assessment & Plan Note (Signed)
The patient has not been experiencing any chest pain or angina.  She is not experiencing any significant shortness of breath

## 2012-12-08 NOTE — Assessment & Plan Note (Signed)
The patient is experiencing some rapid heart action at night.  Her neurologist has ordered a [employer] monitor which is appropriate and I encouraged the patient to go ahead with those procedures.

## 2012-12-08 NOTE — Patient Instructions (Addendum)
Increase your water and salt intake   Your physician recommends that you continue on your current medications as directed. Please refer to the Current Medication list given to you today.  Your physician wants you to follow-up in: 6 month ov/ekg You will receive a reminder letter in the mail two months in advance. If you don't receive a letter, please call our office to schedule the follow-up appointment.

## 2013-01-18 ENCOUNTER — Telehealth: Payer: Self-pay | Admitting: Cardiology

## 2013-01-18 NOTE — Telephone Encounter (Signed)
New Problem  Pt called experiencing irregular heart beat/// offered appointment with PA Lawson Fiscal for Friday @ 9:30 am// and afternoon appt for the week following with Scott.  pt declined appt.. Please assist//

## 2013-01-18 NOTE — Telephone Encounter (Signed)
Pt called because she said has been having palpitations no other symptoms; before pt was having the palpitations  during the night, now she has then all  the time. Pt would like to see Dr. Patty Sermons asap. Pt states has not had the king of heart monitor her neurologist ordered. Pt works in [employer]; she will get an EKG and  will faxed it to this office for Dr. Patty Sermons to read. Pt. is aware that MD will be in the office tomorrow 01/19/13. Pt is aware that I will send this message to MD for recommendations.

## 2013-01-19 NOTE — Telephone Encounter (Signed)
Did we ever receive the faxed EKG?

## 2013-01-20 NOTE — Telephone Encounter (Signed)
Thanks for the followup!  

## 2013-01-20 NOTE — Telephone Encounter (Signed)
The EKG was not faxed. I spoke with Pt this AM. She say that  the EKG was normal (SR )she will make sure that it  is faxed to [place]. Pt states she will have an echo done there  in the [facility] and it will be faxed to you as well.

## 2013-12-27 ENCOUNTER — Telehealth: Payer: Self-pay | Admitting: General Practice

## 2013-12-27 NOTE — Telephone Encounter (Signed)
Okay to send in amoxicillin 500 mg TID #15

## 2013-12-27 NOTE — Telephone Encounter (Signed)
msg left, I will forward to Dr Mare Ferrari to advise.

## 2013-12-27 NOTE — Telephone Encounter (Signed)
New Message      Pt calling stating she has a UTI and is wondering if Dr. Mare Ferrari can call in prescription for it. Please call pt and advise. Pharmacy is with [facility].

## 2013-12-28 MED ORDER — AMOXICILLIN 500 MG PO CAPS: 500.0000 mg | ORAL_CAPSULE | Freq: Three times a day (TID) | ORAL | Status: DC

## 2013-12-28 NOTE — Telephone Encounter (Signed)
Advised patient sent to pharmacy

## 2014-01-18 ENCOUNTER — Ambulatory Visit: Payer: PRIVATE HEALTH INSURANCE | Admitting: Cardiology

## 2014-02-01 ENCOUNTER — Ambulatory Visit: Payer: PRIVATE HEALTH INSURANCE | Admitting: Cardiology

## 2014-03-20 ENCOUNTER — Encounter: Payer: Self-pay | Admitting: *Deleted

## 2014-03-21 ENCOUNTER — Ambulatory Visit (INDEPENDENT_AMBULATORY_CARE_PROVIDER_SITE_OTHER): Payer: PRIVATE HEALTH INSURANCE | Admitting: Cardiology

## 2014-03-21 ENCOUNTER — Encounter: Payer: Self-pay | Admitting: Cardiology

## 2014-03-21 VITALS — BP 114/70 | HR 71 | Ht 67.0 in | Wt 129.0 lb

## 2014-03-21 DIAGNOSIS — R002 Palpitations: Secondary | ICD-10-CM

## 2014-03-21 DIAGNOSIS — I341 Nonrheumatic mitral (valve) prolapse: Secondary | ICD-10-CM

## 2014-03-21 NOTE — Patient Instructions (Signed)
Your physician recommends that you continue on your current medications as directed. Please refer to the Current Medication list given to you today.  Your physician wants you to follow-up in: 1 year ov/ekg You will receive a reminder letter in the mail two months in advance. If you don't receive a letter, please call our office to schedule the follow-up appointment.  

## 2014-03-21 NOTE — Progress Notes (Signed)
Diego Cory Date of Birth:  05-Dec-1960 [place] [place] [place], [place]  [address] [phone]  Fax   [phone]  HPI: This pleasant 54 year old woman is seen for a one-year followup office visit. She has a past history of mitral valve prolapse. She also has a past history of syncope. She has not had any syncopal episodes in more than one year. He does have a history of suspected mitral valve prolapse although her echocardiogram in 2006 showed only trace mitral regurgitation but no definite prolapse. The patient does not have any history of ischemic heart disease. She had a normal nuclear stress test 04/23/07 on the Bruce protocol had no evidence of ischemia and her ejection fraction was 65%.  Since last visit she has lost her mother to cancer.  The patient has also been married since last visit.  She is no longer working.  She has been busy cleaning out her parents house in order to put it on the market the spring. She has had some tendinitis-type symptoms involving her left lower leg anteriorly which has caused her to not walk as much. Current Outpatient Prescriptions  Medication Sig Dispense Refill  . Ascorbic Acid (VITAMIN C) 500 MG tablet Take 500 mg by mouth daily.      Marland Kitchen aspirin 81 MG tablet Take 81 mg by mouth daily.      . Cholecalciferol (VITAMIN D) 2000 UNITS CAPS Take 5,000 Units by mouth daily.     . Multiple Vitamins-Minerals (CENTRUM PO) Take by mouth daily.      . Tacrolimus (PROGRAF PO) Take by mouth daily. PRN     No current facility-administered medications for this visit.    No Active Allergies  Patient Active Problem List   Diagnosis Date Noted  . Osteoarthritis 12/08/2012  . Syncope 08/14/2010  . Mitral valve prolapse 06/12/2010  . Palpitations 06/12/2010  . Anxiety 06/12/2010  . Postmenopausal state 06/12/2010    History  Smoking status  . Never Smoker   Smokeless tobacco  . Not on file    History  Alcohol Use:  Not on file    Family History  Problem Relation Age of Onset  . Cancer Mother   . Cancer Father     Review of Systems: The patient denies any heat or cold intolerance.  No weight gain or weight loss.  The patient denies headaches or blurry vision.  There is no cough or sputum production.  The patient denies dizziness.  There is no hematuria or hematochezia.  The patient denies any muscle aches or arthritis.  Positive for possible tenosynovitis of left lower leg.  The patient denies any rash.  The patient denies frequent falling or instability.  There is no history of depression or anxiety.  All other systems were reviewed and are negative.   Wt Readings from Last 3 Encounters:  03/21/14 129 lb (58.514 kg)  12/08/12 123 lb (55.792 kg)  01/19/12 123 lb (55.792 kg)    Physical Exam: Filed Vitals:   03/21/14 1613  BP: 114/70  Pulse: 71  The patient appears to be in no distress.  Head and neck exam reveals that the pupils are equal and reactive.  The extraocular movements are full.  There is no scleral icterus.  Mouth and pharynx are benign.  No lymphadenopathy.  No carotid bruits.  The jugular venous pressure is normal.  Thyroid is not enlarged or tender.  Chest is clear to percussion and auscultation.  No rales or rhonchi.  Expansion of the chest is symmetrical.  Heart reveals no abnormal lift or heave.  First and second heart sounds are normal.  There is no murmur gallop or rub.  There is a sharp high pitched midsystolic click.  The abdomen is soft and nontender.  Bowel sounds are normoactive.  There is no hepatosplenomegaly or mass.  There are no abdominal bruits.  Extremities reveal no phlebitis or edema.  Pedal pulses are good.  There is no cyanosis or clubbing.  Neurologic exam is normal strength and no lateralizing weakness.  No sensory deficits.  Integument reveals no rash  EKG today shows normal sinus rhythm and no ischemic changes.  Assessment: 1.  Mitral valve  prolapse with past history of palpitations 2.  Remote history of syncope.  No recent events. 3.  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  Possible Tina synovitis of left lower leg.  No evidence of DVT  Disposition: Continue current medication.  Recheck in one year for office visit and EKG.  She is scheduled to have an annual physical so and with Dr. Dub Amis

## 2014-05-19 ENCOUNTER — Encounter (HOSPITAL_COMMUNITY): Payer: Self-pay | Admitting: Emergency Medicine

## 2014-05-19 ENCOUNTER — Emergency Department (HOSPITAL_COMMUNITY)
Admission: EM | Admit: 2014-05-19 | Discharge: 2014-05-19 | Disposition: A | Discharge status: Home / Self Care | Payer: PRIVATE HEALTH INSURANCE | Attending: Emergency Medicine | Admitting: Emergency Medicine

## 2014-05-19 ENCOUNTER — Emergency Department (HOSPITAL_COMMUNITY): Payer: PRIVATE HEALTH INSURANCE

## 2014-05-19 DIAGNOSIS — K625 Hemorrhage of anus and rectum: Secondary | ICD-10-CM | POA: Diagnosis not present

## 2014-05-19 DIAGNOSIS — Z79899 Other long term (current) drug therapy: Secondary | ICD-10-CM | POA: Diagnosis not present

## 2014-05-19 DIAGNOSIS — K529 Noninfective gastroenteritis and colitis, unspecified: Secondary | ICD-10-CM

## 2014-05-19 DIAGNOSIS — M1990 Unspecified osteoarthritis, unspecified site: Secondary | ICD-10-CM | POA: Insufficient documentation

## 2014-05-19 DIAGNOSIS — Z7982 Long term (current) use of aspirin: Secondary | ICD-10-CM | POA: Diagnosis not present

## 2014-05-19 DIAGNOSIS — Z85820 Personal history of malignant melanoma of skin: Secondary | ICD-10-CM | POA: Diagnosis not present

## 2014-05-19 DIAGNOSIS — Z8679 Personal history of other diseases of the circulatory system: Secondary | ICD-10-CM | POA: Insufficient documentation

## 2014-05-19 HISTORY — DX: Malignant (primary) neoplasm, unspecified: C80.1

## 2014-05-19 HISTORY — DX: Diverticulosis of intestine, part unspecified, without perforation or abscess without bleeding: K57.90

## 2014-05-19 LAB — COMPREHENSIVE METABOLIC PANEL
ALBUMIN: 4.1 g/dL (ref 3.5–5.2)
ALK PHOS: 49 U/L (ref 39–117)
ALT: 21 U/L (ref 0–35)
ANION GAP: 11 (ref 5–15)
AST: 24 U/L (ref 0–37)
BILIRUBIN TOTAL: 0.7 mg/dL (ref 0.3–1.2)
BUN: 15 mg/dL (ref 6–23)
CHLORIDE: 103 mmol/L (ref 96–112)
CO2: 25 mmol/L (ref 19–32)
CREATININE: 0.71 mg/dL (ref 0.50–1.10)
Calcium: 9.6 mg/dL (ref 8.4–10.5)
GFR calc non Af Amer: >90 mL/min (ref >90)
GLUCOSE: 99 mg/dL (ref 70–99)
Potassium: 4 mmol/L (ref 3.5–5.1)
Sodium: 139 mmol/L (ref 135–145)
Total Protein: 6.6 g/dL (ref 6.0–8.3)

## 2014-05-19 LAB — LIPASE, BLOOD: Lipase: 25 U/L (ref 11–59)

## 2014-05-19 LAB — URINALYSIS, ROUTINE W REFLEX MICROSCOPIC
BILIRUBIN URINE: NEGATIVE
Glucose, UA: NEGATIVE mg/dL
KETONES UR: 15 mg/dL — AB
Leukocytes, UA: NEGATIVE
NITRITE: NEGATIVE
PH: 6 (ref 5.0–8.0)
PROTEIN: NEGATIVE mg/dL
Specific Gravity, Urine: 1.037 — ABNORMAL HIGH (ref 1.005–1.030)
Urobilinogen, UA: 0.2 mg/dL (ref 0.0–1.0)

## 2014-05-19 LAB — CBC WITH DIFFERENTIAL/PLATELET
Basophils Absolute: 0 10*3/uL (ref 0.0–0.1)
Basophils Relative: 0 % (ref 0–1)
EOS ABS: 0 10*3/uL (ref 0.0–0.7)
EOS PCT: 0 % (ref 0–5)
HCT: 38.7 % (ref 36.0–46.0)
HEMOGLOBIN: 13.2 g/dL (ref 12.0–15.0)
LYMPHS PCT: 6 % — AB (ref 12–46)
Lymphs Abs: 0.7 10*3/uL (ref 0.7–4.0)
MCH: 29.6 pg (ref 26.0–34.0)
MCHC: 34.1 g/dL (ref 30.0–36.0)
MCV: 86.8 fL (ref 78.0–100.0)
Monocytes Absolute: 0.7 10*3/uL (ref 0.1–1.0)
Monocytes Relative: 6 % (ref 3–12)
Neutro Abs: 10.4 10*3/uL — ABNORMAL HIGH (ref 1.7–7.7)
Neutrophils Relative %: 88 % — ABNORMAL HIGH (ref 43–77)
PLATELETS: 201 10*3/uL (ref 150–400)
RBC: 4.46 MIL/uL (ref 3.87–5.11)
RDW: 12 % (ref 11.5–15.5)
WBC: 11.8 10*3/uL — ABNORMAL HIGH (ref 4.0–10.5)

## 2014-05-19 LAB — URINE MICROSCOPIC-ADD ON

## 2014-05-19 LAB — POC OCCULT BLOOD, ED: Fecal Occult Bld: POSITIVE — AB

## 2014-05-19 MED ORDER — CIPROFLOXACIN HCL 500 MG PO TABS
500.0000 mg | ORAL_TABLET | Freq: Two times a day (BID) | ORAL | Status: DC
Start: 2014-05-19 — End: 2014-06-16

## 2014-05-19 MED ORDER — METRONIDAZOLE 500 MG PO TABS: 500.0000 mg | ORAL_TABLET | Freq: Two times a day (BID) | ORAL | Status: DC

## 2014-05-19 MED ORDER — IOHEXOL 300 MG/ML  SOLN
25.0000 mL | Freq: Once | INTRAMUSCULAR | Status: AC | PRN
  Administered 2014-05-19: 25 mL via ORAL

## 2014-05-19 MED ORDER — IOHEXOL 300 MG/ML  SOLN
80.0000 mL | Freq: Once | INTRAMUSCULAR | Status: AC | PRN
  Administered 2014-05-19: 80 mL via INTRAVENOUS

## 2014-05-19 NOTE — ED Provider Notes (Signed)
CSN: [id number]     Arrival date & time 05/19/14  0944 History   First MD Initiated Contact with Patient 05/19/14 [phone]     Chief Complaint  Patient presents with  . Rectal Bleeding     (Consider location/radiation/quality/duration/timing/severity/associated sxs/prior Treatment) HPI Comments: Pt comes in with abdominal pain and bright red blood per rectum that started last night. Pt states that the has had diarrhea that is bright red. Some nausea no vomiting. No history of similar. Denies fever. She was told by pcp to come in today. States that she has had some epigastric pain, back pain and llq pain  The history is provided by the patient. No language interpreter was used.    Past Medical History  Diagnosis Date  . MVP (mitral valve prolapse)   . Arthritis   . GERD (gastroesophageal reflux disease)   . History of melanoma   . Diverticulosis    Past Surgical History  Procedure Laterality Date  . None     Family History  Problem Relation Age of Onset  . Cancer Mother   . Cancer Father    History  Substance Use Topics  . Smoking status: Never Smoker   . Smokeless tobacco: Not on file  . Alcohol Use: No   OB History    No data available     Review of Systems  All other systems reviewed and are negative.     Allergies  Review of patient's allergies indicates no active allergies.  Home Medications   Prior to Admission medications   Medication Sig Start Date End Date Taking? Authorizing Provider  Ascorbic Acid (VITAMIN C) 500 MG tablet Take 500 mg by mouth daily.      Historical Provider, MD  aspirin 81 MG tablet Take 81 mg by mouth daily.      Historical Provider, MD  Cholecalciferol (VITAMIN D) 2000 UNITS CAPS Take 5,000 Units by mouth daily.     Historical Provider, MD  Multiple Vitamins-Minerals (CENTRUM PO) Take by mouth daily.      Historical Provider, MD  Tacrolimus (PROGRAF PO) Take by mouth daily. PRN    Historical Provider, MD   BP 105/62 mmHg  Pulse  66  Temp(Src) 98.3 F (36.8 C) (Oral)  Resp 18  Ht 5\' 7"  (1.702 m)  Wt 125 lb (56.7 kg)  BMI 19.57 kg/m2  SpO2 98%  LMP 04/27/2014 Physical Exam  Constitutional: She is oriented to person, place, and time. She appears well-developed and well-nourished.  Cardiovascular: Normal rate and regular rhythm.   Pulmonary/Chest: Effort normal and breath sounds normal.  Abdominal: Soft. Bowel sounds are normal.  Generalized tenderness  Genitourinary:  No gross blood noted  Musculoskeletal: Normal range of motion.  Neurological: She is alert and oriented to person, place, and time.  Skin: Skin is warm and dry.  Psychiatric: She has a normal mood and affect.  Nursing note and vitals reviewed.   ED Course  Procedures (including critical care time) Labs Review Labs Reviewed  CBC WITH DIFFERENTIAL/PLATELET - Abnormal; Notable for the following:    WBC 11.8 (*)    Neutrophils Relative % 88 (*)    Neutro Abs 10.4 (*)    Lymphocytes Relative 6 (*)    All other components within normal limits  URINALYSIS, ROUTINE W REFLEX MICROSCOPIC - Abnormal; Notable for the following:    Specific Gravity, Urine 1.037 (*)    Hgb urine dipstick TRACE (*)    Ketones, ur 15 (*)    All  other components within normal limits  POC OCCULT BLOOD, ED - Abnormal; Notable for the following:    Fecal Occult Bld POSITIVE (*)    All other components within normal limits  COMPREHENSIVE METABOLIC PANEL  LIPASE, BLOOD  URINE MICROSCOPIC-ADD ON    Imaging Review Ct Abdomen Pelvis W Contrast  05/19/2014   CLINICAL DATA:  Rectal bleeding, history of diverticulosis  EXAM: CT ABDOMEN AND PELVIS WITH CONTRAST  TECHNIQUE: Multidetector CT imaging of the abdomen and pelvis was performed using the standard protocol following bolus administration of intravenous contrast.  CONTRAST:  35mL OMNIPAQUE IOHEXOL 300 MG/ML  SOLN  COMPARISON:  06/23/2005  FINDINGS: Lung bases are free of acute infiltrate or sizable effusion.  The liver,  gallbladder, spleen, adrenal glands pancreas are within normal limits with the exception of a few small hepatic cysts. The kidneys are well visualized bilaterally without renal calculi or urinary tract obstructive changes. Delayed images demonstrate normal excretion of contrast.  The appendix is within normal limits. The uterus and bladder are unremarkable. No pelvic mass lesion is seen. No significant lymphadenopathy is noted. The descending colon and rectosigmoid region are decompressed although some mild edema within the wall is noted likely representing generalized colitis. This does not appear to be ischemic in nature as the IMA is patent. Bony structures are within normal limits.  IMPRESSION: Changes suggestive of colitis in the descending colon and rectosigmoid.   Electronically Signed   By: Inez Catalina M.D.   On: 05/19/2014 12:12     EKG Interpretation None      MDM   Final diagnoses:  Rectal bleed  Colitis    Will treat with flagyl and cipro. Discussed return precautions with pt. Pt blood count wnl. Pt ambulatory by herself without any problem    Glendell Docker, NP 05/19/14 1245  Ernestina Patches, MD 05/20/14 [phone]

## 2014-05-19 NOTE — ED Notes (Signed)
Pt returned from CT scan, ambulated from bed to Bathroom with no difficulty, had small amount of bright red blood in commode after giving urine specimen.

## 2014-05-19 NOTE — ED Notes (Signed)
Pt to ED with c/o rectal bleeding starting last night-- bright red, progressing to bloody mucus per pt. Pt has abd pain and back pain, hx of diverticulosis.

## 2014-05-19 NOTE — Discharge Instructions (Signed)
Follow up as discussed for continued or worsening symptoms Colitis Colitis is inflammation of the colon. Colitis can be a short-term or long-standing (chronic) illness. Crohn's disease and ulcerative colitis are 2 types of colitis which are chronic. They usually require lifelong treatment. CAUSES  There are many different causes of colitis, including:  Viruses.  Germs (bacteria).  Medicine reactions. SYMPTOMS   Diarrhea.  Intestinal bleeding.  Pain.  Fever.  Throwing up (vomiting).  Tiredness (fatigue).  Weight loss.  Bowel blockage. DIAGNOSIS  The diagnosis of colitis is based on examination and stool or blood tests. X-rays, CT scan, and colonoscopy may also be needed. TREATMENT  Treatment may include:  Fluids given through the vein (intravenously).  Bowel rest (nothing to eat or drink for a period of time).  Medicine for pain and diarrhea.  Medicines (antibiotics) that kill germs.  Cortisone medicines.  Surgery. HOME CARE INSTRUCTIONS   Get plenty of rest.  Drink enough water and fluids to keep your urine clear or pale yellow.  Eat a well-balanced diet.  Call your caregiver for follow-up as recommended. SEEK IMMEDIATE MEDICAL CARE IF:   You develop chills.  You have an oral temperature above 102 F (38.9 C), not controlled by medicine.  You have extreme weakness, fainting, or dehydration.  You have repeated vomiting.  You develop severe belly (abdominal) pain or are passing bloody or tarry stools. MAKE SURE YOU:   Understand these instructions.  Will watch your condition.  Will get help right away if you are not doing well or get worse. Document Released: 04/03/2004 Document Revised: 05/19/2011 Document Reviewed: 06/29/2009 [facility] Patient Information 2015 [place], [place]. This information is not intended to replace advice given to you by your health care provider. Make sure you discuss any questions you have with your health care  provider.

## 2014-05-19 NOTE — ED Notes (Signed)
Pt finished drinking contrast. CT notified. 

## 2014-06-16 ENCOUNTER — Other Ambulatory Visit: Payer: Self-pay

## 2014-06-16 ENCOUNTER — Ambulatory Visit (INDEPENDENT_AMBULATORY_CARE_PROVIDER_SITE_OTHER): Payer: PRIVATE HEALTH INSURANCE | Admitting: Cardiology

## 2014-06-16 ENCOUNTER — Emergency Department (HOSPITAL_COMMUNITY)
Admission: EM | Admit: 2014-06-16 | Discharge: 2014-06-16 | Disposition: A | Discharge status: Home / Self Care | Payer: PRIVATE HEALTH INSURANCE | Attending: Emergency Medicine | Admitting: Emergency Medicine

## 2014-06-16 ENCOUNTER — Encounter: Payer: Self-pay | Admitting: Cardiology

## 2014-06-16 ENCOUNTER — Emergency Department (HOSPITAL_COMMUNITY): Payer: PRIVATE HEALTH INSURANCE

## 2014-06-16 ENCOUNTER — Encounter (HOSPITAL_COMMUNITY): Payer: Self-pay | Admitting: *Deleted

## 2014-06-16 VITALS — BP 118/78 | HR 64 | Ht 67.0 in | Wt 125.8 lb

## 2014-06-16 DIAGNOSIS — R55 Syncope and collapse: Secondary | ICD-10-CM | POA: Insufficient documentation

## 2014-06-16 DIAGNOSIS — M1990 Unspecified osteoarthritis, unspecified site: Secondary | ICD-10-CM | POA: Diagnosis not present

## 2014-06-16 DIAGNOSIS — Z85820 Personal history of malignant melanoma of skin: Secondary | ICD-10-CM | POA: Insufficient documentation

## 2014-06-16 DIAGNOSIS — R079 Chest pain, unspecified: Secondary | ICD-10-CM | POA: Diagnosis not present

## 2014-06-16 DIAGNOSIS — R002 Palpitations: Secondary | ICD-10-CM

## 2014-06-16 DIAGNOSIS — Z8719 Personal history of other diseases of the digestive system: Secondary | ICD-10-CM | POA: Insufficient documentation

## 2014-06-16 DIAGNOSIS — Z7982 Long term (current) use of aspirin: Secondary | ICD-10-CM | POA: Diagnosis not present

## 2014-06-16 DIAGNOSIS — Z79899 Other long term (current) drug therapy: Secondary | ICD-10-CM | POA: Diagnosis not present

## 2014-06-16 DIAGNOSIS — Z3202 Encounter for pregnancy test, result negative: Secondary | ICD-10-CM | POA: Insufficient documentation

## 2014-06-16 DIAGNOSIS — R42 Dizziness and giddiness: Secondary | ICD-10-CM

## 2014-06-16 DIAGNOSIS — I341 Nonrheumatic mitral (valve) prolapse: Secondary | ICD-10-CM | POA: Diagnosis not present

## 2014-06-16 DIAGNOSIS — Z8679 Personal history of other diseases of the circulatory system: Secondary | ICD-10-CM | POA: Insufficient documentation

## 2014-06-16 DIAGNOSIS — R110 Nausea: Secondary | ICD-10-CM | POA: Diagnosis not present

## 2014-06-16 DIAGNOSIS — Z792 Long term (current) use of antibiotics: Secondary | ICD-10-CM | POA: Diagnosis not present

## 2014-06-16 LAB — COMPREHENSIVE METABOLIC PANEL
ALBUMIN: 3.8 g/dL (ref 3.5–5.2)
ALK PHOS: 40 U/L (ref 39–117)
ALT: 14 U/L (ref 0–35)
ANION GAP: 8 (ref 5–15)
AST: 16 U/L (ref 0–37)
BUN: 11 mg/dL (ref 6–23)
CHLORIDE: 104 mmol/L (ref 96–112)
CO2: 27 mmol/L (ref 19–32)
Calcium: 9 mg/dL (ref 8.4–10.5)
Creatinine, Ser: 0.72 mg/dL (ref 0.50–1.10)
GFR calc Af Amer: >90 mL/min (ref >90)
GFR calc non Af Amer: >90 mL/min (ref >90)
GLUCOSE: 101 mg/dL — AB (ref 70–99)
Potassium: 3.4 mmol/L — ABNORMAL LOW (ref 3.5–5.1)
SODIUM: 139 mmol/L (ref 135–145)
Total Bilirubin: 0.7 mg/dL (ref 0.3–1.2)
Total Protein: 5.9 g/dL — ABNORMAL LOW (ref 6.0–8.3)

## 2014-06-16 LAB — URINALYSIS, ROUTINE W REFLEX MICROSCOPIC
Bilirubin Urine: NEGATIVE
Glucose, UA: NEGATIVE mg/dL
Hgb urine dipstick: NEGATIVE
Ketones, ur: 15 mg/dL — AB
Leukocytes, UA: NEGATIVE
Nitrite: NEGATIVE
PROTEIN: NEGATIVE mg/dL
SPECIFIC GRAVITY, URINE: 1.021 (ref 1.005–1.030)
Urobilinogen, UA: 0.2 mg/dL (ref 0.0–1.0)
pH: 6 (ref 5.0–8.0)

## 2014-06-16 LAB — CBC WITH DIFFERENTIAL/PLATELET
BASOS ABS: 0 10*3/uL (ref 0.0–0.1)
BASOS PCT: 0 % (ref 0–1)
EOS PCT: 2 % (ref 0–5)
Eosinophils Absolute: 0.2 10*3/uL (ref 0.0–0.7)
HCT: 36.9 % (ref 36.0–46.0)
Hemoglobin: 12.4 g/dL (ref 12.0–15.0)
LYMPHS ABS: 1.2 10*3/uL (ref 0.7–4.0)
Lymphocytes Relative: 13 % (ref 12–46)
MCH: 29.7 pg (ref 26.0–34.0)
MCHC: 33.6 g/dL (ref 30.0–36.0)
MCV: 88.5 fL (ref 78.0–100.0)
MONO ABS: 0.6 10*3/uL (ref 0.1–1.0)
Monocytes Relative: 7 % (ref 3–12)
Neutro Abs: 7.2 10*3/uL (ref 1.7–7.7)
Neutrophils Relative %: 78 % — ABNORMAL HIGH (ref 43–77)
PLATELETS: 164 10*3/uL (ref 150–400)
RBC: 4.17 MIL/uL (ref 3.87–5.11)
RDW: 12.7 % (ref 11.5–15.5)
WBC: 9.2 10*3/uL (ref 4.0–10.5)

## 2014-06-16 LAB — RAPID URINE DRUG SCREEN, HOSP PERFORMED
AMPHETAMINES: NOT DETECTED
BENZODIAZEPINES: NOT DETECTED
Barbiturates: NOT DETECTED
COCAINE: NOT DETECTED
Opiates: POSITIVE — AB
Tetrahydrocannabinol: NOT DETECTED

## 2014-06-16 LAB — LIPASE, BLOOD: Lipase: 29 U/L (ref 11–59)

## 2014-06-16 LAB — I-STAT CG4 LACTIC ACID, ED: Lactic Acid, Venous: 0.62 mmol/L (ref 0.5–2.0)

## 2014-06-16 LAB — MAGNESIUM: MAGNESIUM: 2.1 mg/dL (ref 1.5–2.5)

## 2014-06-16 LAB — ETHANOL: Alcohol, Ethyl (B): <5 mg/dL (ref 0–9)

## 2014-06-16 LAB — POC URINE PREG, ED: Preg Test, Ur: NEGATIVE

## 2014-06-16 MED ORDER — SODIUM CHLORIDE 0.9 % IV BOLUS (SEPSIS)
1000.0000 mL | Freq: Once | INTRAVENOUS | Status: AC
  Administered 2014-06-16: 1000 mL via INTRAVENOUS

## 2014-06-16 MED ORDER — POTASSIUM CHLORIDE CRYS ER 20 MEQ PO TBCR
40.0000 meq | EXTENDED_RELEASE_TABLET | Freq: Once | ORAL | Status: AC
  Administered 2014-06-16: 40 meq via ORAL
  Filled 2014-06-16: qty 2

## 2014-06-16 NOTE — ED Notes (Signed)
Assisted pt back to room from bathroom.

## 2014-06-16 NOTE — ED Notes (Addendum)
Pt to ED from home via [facility] c/o near syncopal episode with nausea. Reports sitting on the couch and felt like her heart was beating fast. Pt reports when she got up she felt weak and fell to her knees, no LOC, no injuries.EMS gave 4mg  zofran. VS bp 123/59; hr 65; resp 16; spo2 100%. CBG 112

## 2014-06-16 NOTE — ED Provider Notes (Signed)
CSN: [id number]     Arrival date & time 06/16/14  0043 History   First MD Initiated Contact with Patient 06/16/14 0117     This chart was scribed for Everlene Balls, MD by Forrestine Him, ED Scribe. This patient was seen in room A01C/A01C and the patient's care was started 1:21 AM.   Chief Complaint  Patient presents with  . Near Syncope   The history is provided by the patient. No language interpreter was used.    HPI Comments: Catherine Allison brought in by EMS is a 54 y.o. female with a PMHx of MVP, GERD, diverticulosis, and cancer- melanoma 2009 who presents to the Emergency Department after an episode of near syncope onset approximately 11 PM yesterday evening while at home. Pt states she was sitting at home when she felt her heart "pounding, but not racing". Afterwards she went into the kitchen when her lefts gave out from underneath her. Ms. Derk states she was moving a lot of boxes this afternoon and says she experienced a short episode of sharp chest pain without any associated symptoms prior to episode of near syncope. Pain has sense resolved. No fever, chills, vomiting, SOB, HA, leg swelling at time of episode. She now reports constant, ongoing dizziness, nausea, and 2 episodes of diarrhea early this morning. Pt was given 4 mg of Zofran en route to ED without any improvement for symptoms. No recent long distance travel. No known allergies to medications.  Past Medical History  Diagnosis Date  . MVP (mitral valve prolapse)   . Arthritis   . GERD (gastroesophageal reflux disease)   . History of melanoma   . Diverticulosis   . Cancer     melanoma 2009.  bottom left leg.     Past Surgical History  Procedure Laterality Date  . None     Family History  Problem Relation Age of Onset  . Cancer Mother   . Cancer Father    History  Substance Use Topics  . Smoking status: Never Smoker   . Smokeless tobacco: Not on file  . Alcohol Use: No   OB History    No data available     Review  of Systems  Constitutional: Negative for fever and chills.  Respiratory: Negative for shortness of breath.   Cardiovascular: Positive for chest pain.  Gastrointestinal: Positive for nausea. Negative for vomiting.  Neurological: Positive for dizziness.  All other systems reviewed and are negative.     Allergies  Review of patient's allergies indicates no known allergies.  Home Medications   Prior to Admission medications   Medication Sig Start Date End Date Taking? Authorizing Provider  Ascorbic Acid (VITAMIN C) 500 MG tablet Take 500 mg by mouth daily.      Historical Provider, MD  aspirin 81 MG tablet Take 81 mg by mouth daily.      Historical Provider, MD  Cholecalciferol (VITAMIN D) 2000 UNITS CAPS Take 4,000 Units by mouth daily.     Historical Provider, MD  ciprofloxacin (CIPRO) 500 MG tablet Take 1 tablet (500 mg total) by mouth 2 (two) times daily. 05/19/14   Glendell Docker, NP  metroNIDAZOLE (FLAGYL) 500 MG tablet Take 1 tablet (500 mg total) by mouth 2 (two) times daily. 05/19/14   Glendell Docker, NP  Multiple Vitamins-Minerals (CENTRUM PO) Take by mouth daily.      Historical Provider, MD  traZODone (DESYREL) 50 MG tablet Take 50 mg by mouth. 05/17/14 06/16/14  Historical Provider, MD  Triage Vitals: BP 108/48 mmHg  Pulse 87  Temp(Src) 97.9 F (36.6 C) (Oral)  Resp 22  SpO2 99%  LMP 05/29/2014   Physical Exam  Constitutional: She is oriented to person, place, and time. She appears well-developed and well-nourished. No distress.  HENT:  Head: Normocephalic and atraumatic.  Nose: Nose normal.  Mouth/Throat: Oropharynx is clear and moist. No oropharyngeal exudate.  Eyes: Conjunctivae and EOM are normal. Pupils are equal, round, and reactive to light. No scleral icterus.  Neck: Normal range of motion. Neck supple. No JVD present. No tracheal deviation present. No thyromegaly present.  Cardiovascular: Normal rate, regular rhythm and normal heart sounds.  Exam reveals no  gallop and no friction rub.   No murmur heard. Pulmonary/Chest: Effort normal and breath sounds normal. No respiratory distress. She has no wheezes. She exhibits no tenderness.  Abdominal: Soft. Bowel sounds are normal. She exhibits no distension and no mass. There is no tenderness. There is no rebound and no guarding.  Musculoskeletal: Normal range of motion. She exhibits no edema or tenderness.  Lymphadenopathy:    She has no cervical adenopathy.  Neurological: She is alert and oriented to person, place, and time. No cranial nerve deficit. She exhibits normal muscle tone.  Skin: Skin is warm and dry. No rash noted. No erythema. No pallor.  Nursing note and vitals reviewed.   ED Course  Procedures (including critical care time)  DIAGNOSTIC STUDIES: Oxygen Saturation is 99% on RA, Normal by my interpretation.    COORDINATION OF CARE: 1:20 AM-Discussed treatment plan with pt at bedside and pt agreed to plan.  a   Labs Review Labs Reviewed  CBC WITH DIFFERENTIAL/PLATELET - Abnormal; Notable for the following:    Neutrophils Relative % 78 (*)    All other components within normal limits  COMPREHENSIVE METABOLIC PANEL - Abnormal; Notable for the following:    Potassium 3.4 (*)    Glucose, Bld 101 (*)    Total Protein 5.9 (*)    All other components within normal limits  URINALYSIS, ROUTINE W REFLEX MICROSCOPIC - Abnormal; Notable for the following:    APPearance HAZY (*)    Ketones, ur 15 (*)    All other components within normal limits  LIPASE, BLOOD  ETHANOL  MAGNESIUM  URINE RAPID DRUG SCREEN (HOSP PERFORMED)  I-STAT CG4 LACTIC ACID, ED  POC URINE PREG, ED  CBG MONITORING, ED    Imaging Review Dg Chest 2 View  06/16/2014   CLINICAL DATA:  Near syncope.  History of mitral valve prolapse  EXAM: CHEST  2 VIEW  COMPARISON:  None.  FINDINGS: Prominent retrosternal clear space but no depression of the diaphragm. There is no edema, consolidation, effusion, or pneumothorax. Normal  heart size and aortic contours.  IMPRESSION: No active cardiopulmonary disease.   Electronically Signed   By: Monte Fantasia M.D.   On: 06/16/2014 02:19     EKG Interpretation   Date/Time:  Friday June 16 2014 01:13:07 EDT Ventricular Rate:  61 PR Interval:  160 QRS Duration: 80 QT Interval:  447 QTC Calculation: 450 R Axis:   83 Text Interpretation:  Sinus rhythm Confirmed by Glynn Octave  [phone]) on 06/16/2014 3:09:29 AM      MDM   Final diagnoses:  Near syncope    Patient presents emergency department for near syncopal episode. This occurred at home and she denies ever having this happen in the past. She had palpitations as well. Emergency department workup for syncope is negative.  Her electrolytes are within normal limits, potassium was slightly low and replaced. Chest x-ray EKG did not show a cause for near syncope. Infectious workup is also negative. Patient's been hemodynamically stable emergency department without any further episodes of syncope. Her vital signs were within her normal limits and she is safe for discharge with follow-up with her primary care physician within 3 days.  I personally performed the services described in this documentation, which was scribed in my presence. The recorded information has been reviewed and is accurate.   Everlene Balls, MD 06/16/14 [phone]

## 2014-06-16 NOTE — ED Notes (Signed)
Pt reports being confused while driving today, reports feelings of panic.

## 2014-06-16 NOTE — Patient Instructions (Signed)
Medication Instructions:  Your physician recommends that you continue on your current medications as directed. Please refer to the Current Medication list given to you today.  Labwork: NONE  Testing/Procedures: NONE Follow-Up: Your physician wants you to follow-up in: January  You will receive a reminder letter in the mail two months in advance. If you don't receive a letter, please call our office to schedule the follow-up appointment.   Any Other Special Instructions Will Be Listed Below (If Applicable).

## 2014-06-16 NOTE — Discharge Instructions (Signed)
Near-Syncope [name], see your primary care physician within 3 days for close follow-up. His symptoms worsen come back to emergency department immediately. Be sure to stay well hydrated and check your blood pressures at home.  Thank you. Near-syncope (commonly known as near fainting) is sudden weakness, dizziness, or feeling like you might pass out. This can happen when getting up or while standing for a long time. It is caused by a sudden decrease in blood flow to the brain, which can occur for various reasons. Most of the reasons are not serious.  HOME CARE Watch your condition for any changes.  Have someone stay with you until you feel stable.  If you feel like you are going to pass out:  Lie down right away.  Prop your feet up if you can.  Breathe deeply and steadily.  Move only when the feeling has gone away. Most of the time, this feeling lasts only a few minutes. You may feel tired for several hours.  Drink enough fluids to keep your pee (urine) clear or pale yellow.  If you are taking blood pressure or heart medicine, stand up slowly.  Follow up with your doctor as told. GET HELP RIGHT AWAY IF:   You have a severe headache.  You have unusual pain in the chest, belly (abdomen), or back.  You have bleeding from the mouth or butt (rectum), or you have black or tarry poop (stool).  You feel your heart beat differently than normal, or you have a very fast pulse.  You pass out, or you twitch and shake when you pass out.  You pass out when sitting or lying down.  You feel confused.  You have trouble walking.  You are weak.  You have vision problems. MAKE SURE YOU:   Understand these instructions.  Will watch your condition.  Will get help right away if you are not doing well or get worse. Document Released: 08/13/2007 Document Revised: 03/01/2013 Document Reviewed: 07/30/2012 [facility] Patient Information 2015 [place], [place]. This information is not intended to  replace advice given to you by your health care provider. Make sure you discuss any questions you have with your health care provider.

## 2014-06-16 NOTE — Progress Notes (Signed)
Cardiology Office Note   Date:  06/16/2014   ID:  NARMEEN KERPER, DOB [birth year], MRN [id number]  PCP:  Darlin Coco, MD  Cardiologist:   Darlin Coco, MD   No chief complaint on file.     History of Present Illness: Catherine Allison is a 54 y.o. female who presents for a work in follow-up office visit after emergency room visit last night  This pleasant 54 year old woman is seen for a work and follow-up office visit.  Last night she had been sitting watching TV for several hours without stirring much.  When she got up at about 10:30 PM to get ready for bed her legs give out from under her .she fell to the floor.  She did not black out or lose consciousness or have syncope.  It was just that her legs were very weak.  She was home alone at the time.  She called her brother who came over to check on her.  She was still having weakness in the legs and her brother called EMS and she was taken to the emergency room where she was evaluated and discharged.  No cause for her episode was found.    She has a past history of mitral valve prolapse. She also has a past history of syncope. She has not had any syncopal episodes in more than one year. He does have a history of suspected mitral valve prolapse although her echocardiogram in 2006 showed only trace mitral regurgitation but no definite prolapse. The patient does not have any history of ischemic heart disease. She had a normal nuclear stress test 04/23/07 on the Bruce protocol had no evidence of ischemia and her ejection fraction was 65%.  The patient has been under a lot of stress.  She has been busy cleaning out her parents home after both of the parents died last year.  The patient is recently married.  Her husband lives and works in [place].  Her husband's parents are also quite ill and she has been driving back and forth to [place] to check on them.  She is not currently working but is anxious to get back to work. Patient has been  having some GI symptoms.  She saw Dr. Alycia Rossetti recently.  He was a possible question of colitis.  He has been experiencing early satiety although her weight has not changed significantly.  Her husband feels that she may be depressed.  She has upcoming visits with GI in [place] and also a psychiatrist in [place]. Past Medical History  Diagnosis Date  . MVP (mitral valve prolapse)   . Arthritis   . GERD (gastroesophageal reflux disease)   . History of melanoma   . Diverticulosis   . Cancer     melanoma 2009.  bottom left leg.      Past Surgical History  Procedure Laterality Date  . None       Current Outpatient Prescriptions  Medication Sig Dispense Refill  . Ascorbic Acid (VITAMIN C) 500 MG tablet Take 500 mg by mouth daily.      . Cholecalciferol (VITAMIN D) 2000 UNITS CAPS Take 4,000 Units by mouth daily.     . Multiple Vitamin (MULTIVITAMIN WITH MINERALS) TABS tablet Take 1 tablet by mouth daily.    . traZODone (DESYREL) 50 MG tablet Take 50 mg by mouth at bedtime.      No current facility-administered medications for this visit.    Allergies:   Review of patient's allergies indicates no  known allergies.    Social History:  The patient  reports that she has never smoked. She does not have any smokeless tobacco history on file. She reports that she does not drink alcohol or use illicit drugs.   Family History:  The patient's family history includes Cancer in her father and mother.    ROS:  Please see the history of present illness.   Otherwise, review of systems are positive for none.   All other systems are reviewed and negative.    PHYSICAL EXAM: VS:  BP 118/78 mmHg  Pulse 64  Ht 5\' 7"  (1.702 m)  Wt 125 lb 12.8 oz (57.063 kg)  BMI 19.70 kg/m2  LMP 05/29/2014 , BMI Body mass index is 19.7 kg/(m^2). GEN: Well nourished, well developed, in no acute distress HEENT: normal Neck: no JVD, carotid bruits, or masses Cardiac: RRR; no murmurs, rubs, or gallops,no edema    Respiratory:  clear to auscultation bilaterally, normal work of breathing GI: soft, nontender, nondistended, + BS MS: no deformity or atrophy Skin: warm and dry, no rash Neuro:  Strength and sensation are intact Psych: euthymic mood, full affect   EKG:  EKG is not ordered today. The electrocardiogram taken emergency room last night was reviewed and shows no ischemic changes and she is in normal sinus rhythm   Recent Labs: 06/16/2014: ALT 14; BUN 11; Creatinine 0.72; Hemoglobin 12.4; Magnesium 2.1; Platelets 164; Potassium 3.4*; Sodium 139    Lipid Panel No results found for: CHOL, TRIG, HDL, CHOLHDL, VLDL, LDLCALC, LDLDIRECT    Wt Readings from Last 3 Encounters:  06/16/14 125 lb 12.8 oz (57.063 kg)  05/19/14 125 lb (56.7 kg)  03/21/14 129 lb (58.514 kg)        ASSESSMENT AND PLAN:  1. Mitral valve prolapse with past history of palpitations 2. Remote history of syncope. No recent events.  Last night's episode was one of leg weakness but no syncope  3. 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  Situational stress.  Question depression   Current medicines are reviewed at length with the patient today.  The patient does not have concerns regarding medicines.  The following changes have been made:  no change  Labs/ tests ordered today include:  No orders of the defined types were placed in this encounter.     Position: I encouraged her to keep the follow-up visit with her gastroenterologist in [place] and also to keep the psychiatry visit.  I encouraged her to finish up what needs to be done here in [place] so that she can get back to [place].  Continue current medication.  Recheck in January as scheduled.  Signed, Darlin Coco, MD  06/16/2014 6:35 PM    [facility] [address], [place], [place]  [address] Phone: [phone]; Fax: [phone]

## 2014-06-16 NOTE — ED Notes (Addendum)
Pt c/o near syncope while at home. Reports on the couch when pts heart felt like it was "pounding, but not racing." Pt felt increased dizziness after standing and fell to her knees. Since episode, pt c/o dizziness and nausea. EMS gave 4mg  zofran without improvement. Also reports an episode of chest pain, radiating into shoulder this afternoon while moving boxes associated with nausea only, denies shortness of breath or recent travel

## 2015-04-18 ENCOUNTER — Encounter: Payer: Self-pay | Admitting: Cardiology

## 2015-04-18 ENCOUNTER — Ambulatory Visit (INDEPENDENT_AMBULATORY_CARE_PROVIDER_SITE_OTHER): Payer: PRIVATE HEALTH INSURANCE | Admitting: Cardiology

## 2015-04-18 VITALS — BP 120/70 | HR 66 | Ht 67.0 in | Wt 128.8 lb

## 2015-04-18 DIAGNOSIS — R002 Palpitations: Secondary | ICD-10-CM

## 2015-04-18 NOTE — Patient Instructions (Signed)
Medication Instructions:  Your physician recommends that you continue on your current medications as directed. Please refer to the Current Medication list given to you today.   Labwork: -None  Testing/Procedures: -None  Follow-Up: Your physician wants you to follow-up in: one year with Dr. Meda Coffee and a EKG.  You will receive a reminder letter in the mail two months in advance. If you don't receive a letter, please call our office to schedule the follow-up appointment.   Any Other Special Instructions Will Be Listed Below (If Applicable).     If you need a refill on your cardiac medications before your next appointment, please call your pharmacy.

## 2015-04-18 NOTE — Progress Notes (Signed)
Cardiology Office Note   Date:  04/18/2015   ID:  MUSKAAN WAMMACK, DOB 09-13-1960, MRN [id number]  PCP:  Velna Hatchet, MD  Cardiologist: Darlin Coco MD  No chief complaint on file.     History of Present Illness: Catherine Allison is a 55 y.o. female who presents for Follow-up office visit  She has a past history of mitral valve prolapse. She also has a past history of syncope. She has not had any syncopal episodes in more than one year. He does have a history of suspected mitral valve prolapse although her echocardiogram in 2006 showed only trace mitral regurgitation but no definite prolapse. The patient does not have any history of ischemic heart disease. She had a normal nuclear stress test 04/23/07 on the Bruce protocol had no evidence of ischemia and her ejection fraction was 65%.  The patient has been under a lot of stress. She lost both of her parents within the past year and a half. She is seeing a psychiatrist in [place].  She is on Antidepressant medication. She is not getting any regular exercise.  She intends to start walking in her neighborhood as the weather gets warmer.   Past Medical History  Diagnosis Date  . MVP (mitral valve prolapse)   . Arthritis   . GERD (gastroesophageal reflux disease)   . History of melanoma   . Diverticulosis   . Cancer ([place])     melanoma 2009.  bottom left leg.      Past Surgical History  Procedure Laterality Date  . None       Current Outpatient Prescriptions  Medication Sig Dispense Refill  . Ascorbic Acid (VITAMIN C) 500 MG tablet Take 500 mg by mouth daily.      . Cholecalciferol (VITAMIN D) 2000 UNITS CAPS Take 4,000 Units by mouth daily.     . cyclobenzaprine (FLEXERIL) 5 MG tablet Take 1 tablet by mouth every evening.    . Multiple Vitamin (MULTIVITAMIN WITH MINERALS) TABS tablet Take 1 tablet by mouth daily.    . Vortioxetine HBr (TRINTELLIX) 5 MG TABS Take 5 mg by mouth at bedtime.    . traZODone (DESYREL) 50  MG tablet Take 50 mg by mouth at bedtime.      No current facility-administered medications for this visit.    Allergies:   Trazodone    Social History:  The patient  reports that she has never smoked. She does not have any smokeless tobacco history on file. She reports that she does not drink alcohol or use illicit drugs.   Family History:  The patient's family history includes Cancer in her father and mother.    ROS:  Please see the history of present illness.   Otherwise, review of systems are positive for none.   All other systems are reviewed and negative.    PHYSICAL EXAM: VS:  BP 120/70 mmHg  Pulse 66  Ht 5\' 7"  (1.702 m)  Wt 128 lb 12.8 oz (58.423 kg)  BMI 20.17 kg/m2 , BMI Body mass index is 20.17 kg/(m^2). GEN: Well nourished, well developed, in no acute distress HEENT: normal Neck: no JVD, carotid bruits, or masses Cardiac: RRR; no murmurs, rubs, or gallops,no edema.There is a soft midsystolic click.  Respiratory:  clear to auscultation bilaterally, normal work of breathing GI: soft, nontender, nondistended, + BS MS: no deformity or atrophy Skin: warm and dry, no rash Neuro:  Strength and sensation are intact Psych: euthymic mood, full affect  EKG:  EKG is ordered today. The ekg ordered today demonstrates Normal sinus rhythm at 67 bpm.  No ischemic changes.   Recent Labs: 06/16/2014: ALT 14; BUN 11; Creatinine, Ser 0.72; Hemoglobin 12.4; Magnesium 2.1; Platelets 164; Potassium 3.4*; Sodium 139    Lipid Panel No results found for: CHOL, TRIG, HDL, CHOLHDL, VLDL, LDLCALC, LDLDIRECT    Wt Readings from Last 3 Encounters:  04/18/15 128 lb 12.8 oz (58.423 kg)  06/16/14 125 lb 12.8 oz (57.063 kg)  05/19/14 125 lb (56.7 kg)        ASSESSMENT AND PLAN:  1. Mitral valve prolapse with past history of palpitations 2. Remote history of syncope. No recent events. Last night's episode was one of leg weakness but no syncope  3. Past history of lichen planus  of mouth, no recent activity 4. Situational stressAnd depression, followed by psychiatrist in [place] Dr. Randa Spike.   Current medicines are reviewed at length with the patient today.  The patient does not have concerns regarding medicines.  The following changes have been made:  no change  Labs/ tests ordered today include:   Orders Placed This Encounter  Procedures  . EKG 12-Lead     Disposition:  The patient's PCP is Dr.Holwerda. She will return in one year for cardiology follow-up and office visit and EKG with Dr. Meda Coffee.   Berna Spare MD 04/18/2015 5:39 PM    [address], [place], [place]  [address] Phone: [phone]; Fax: [phone]

## 2017-03-07 IMAGING — CT CT ABD-PELV W/ CM
2 of 5 series · 17 of 46 positions shown, 19 images · IV contrast (omnipaque)
Comparison: 06/23/2005

CLINICAL DATA: Rectal bleeding, history of diverticulosis

EXAM:
CT ABDOMEN AND PELVIS WITH CONTRAST
TECHNIQUE: Multidetector CT imaging of the abdomen and pelvis was performed
using the standard protocol following bolus administration of
intravenous contrast.
CONTRAST:  80mL OMNIPAQUE IOHEXOL 300 MG/ML  SOLN

[Series 2: abd/ pelvis 5.0 i30f 1 · axial · 0.70mm/px · z∈[+986,+1346]mm · 14 of 82 slices shown, 16 images]
[im 5/82  soft-tissue]
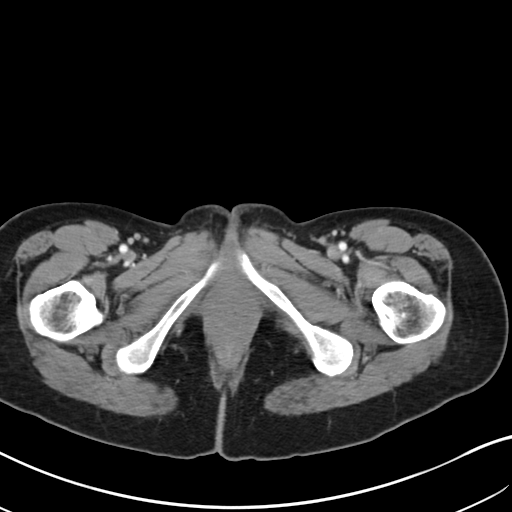
[im 5/82  bone]
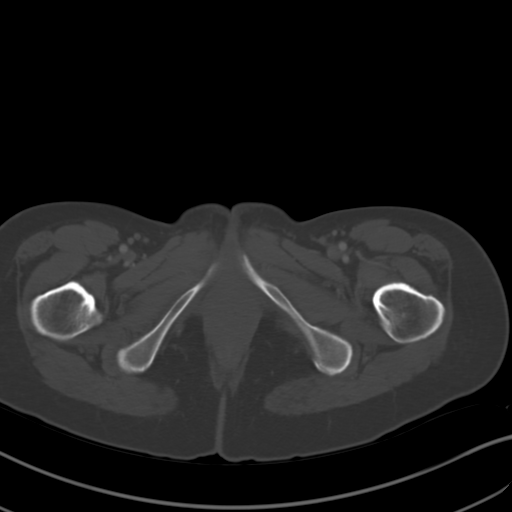
[im 9/82  soft-tissue]
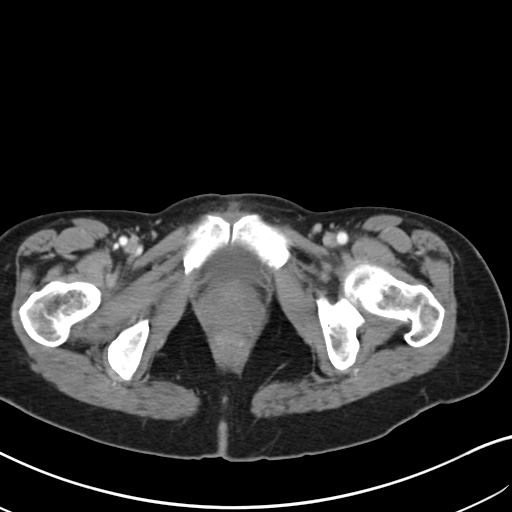
[im 18/82  soft-tissue]
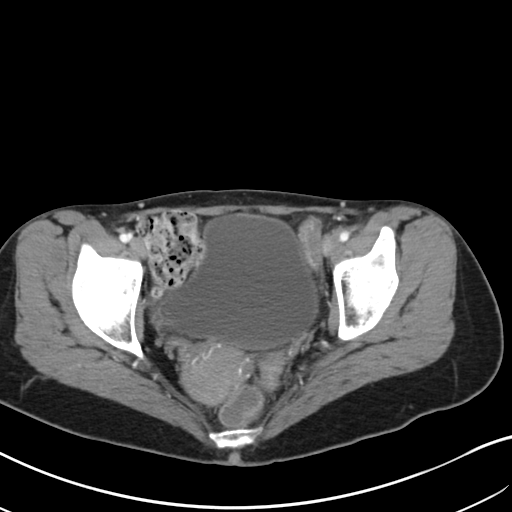
[im 22/82  soft-tissue]
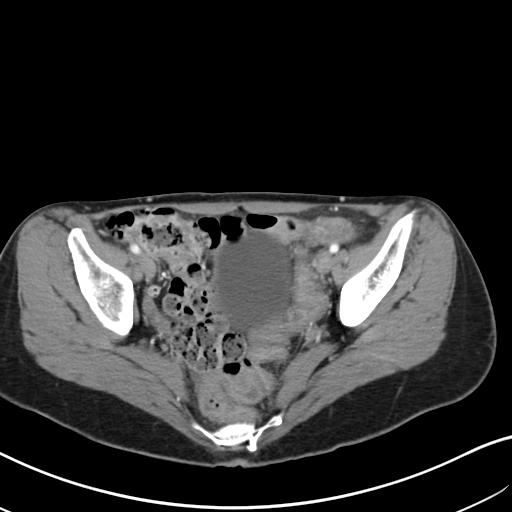
[im 26/82  soft-tissue]
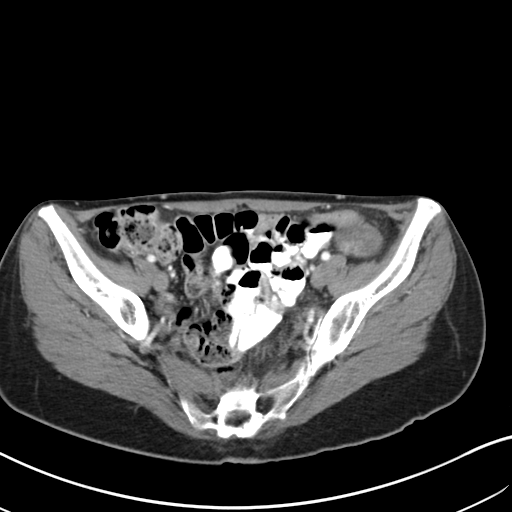
[im 35/82  soft-tissue]
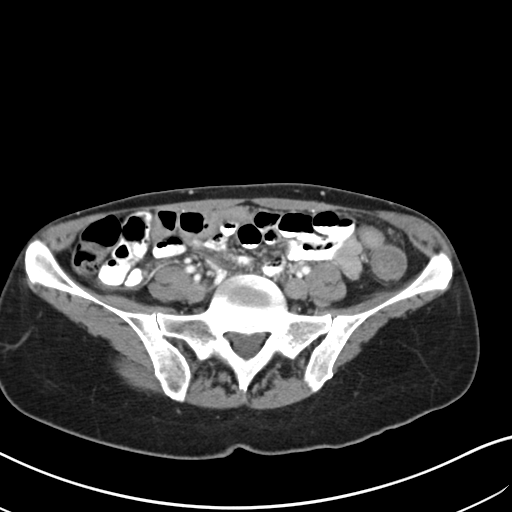
[im 39/82  soft-tissue]
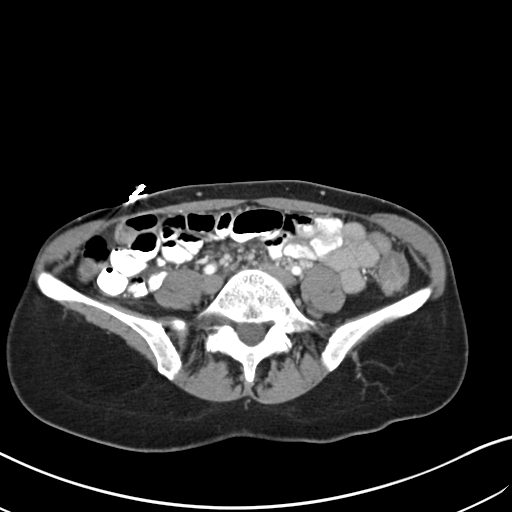
[im 43/82  soft-tissue]
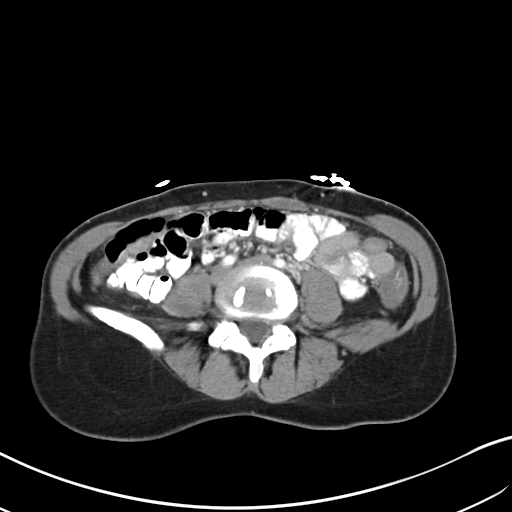
[im 47/82  soft-tissue]
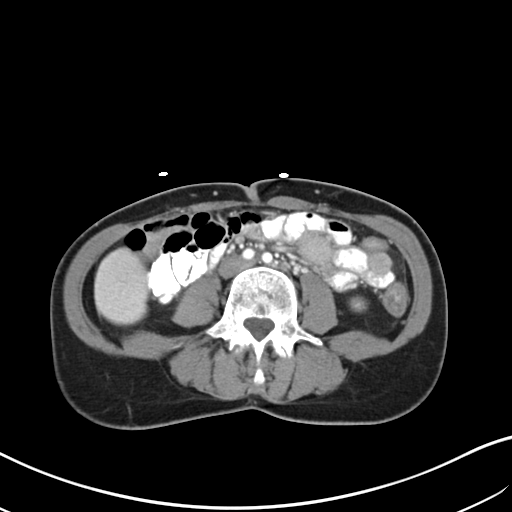
[im 47/82  bone]
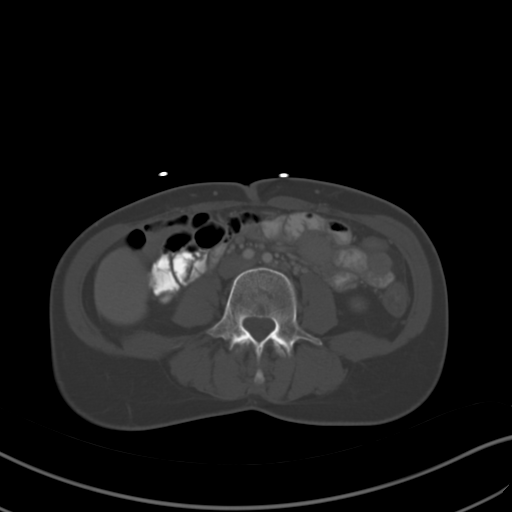
[im 56/82  soft-tissue]
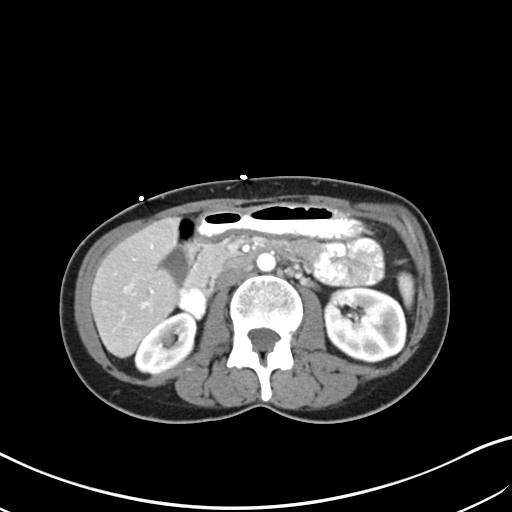
[im 60/82  soft-tissue]
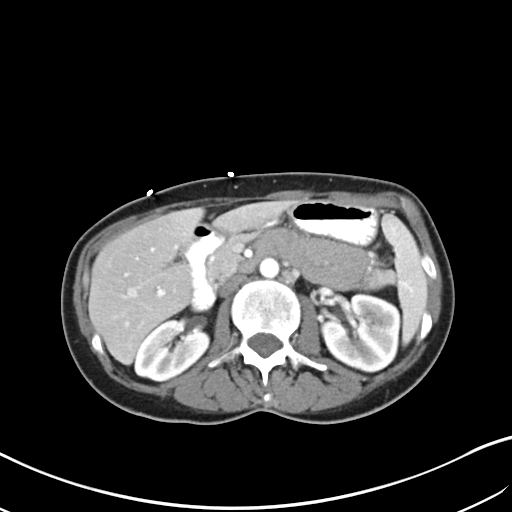
[im 64/82  soft-tissue]
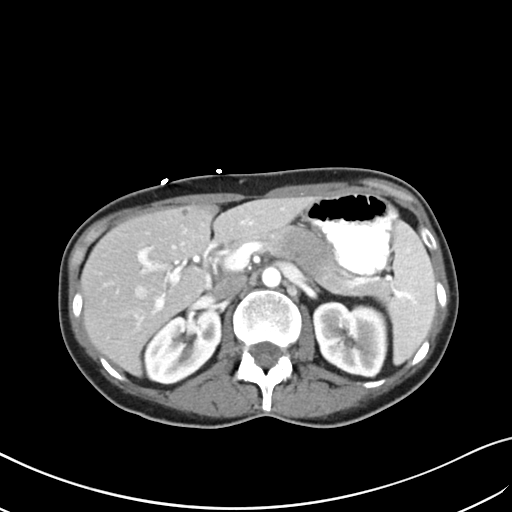
[im 73/82  soft-tissue]
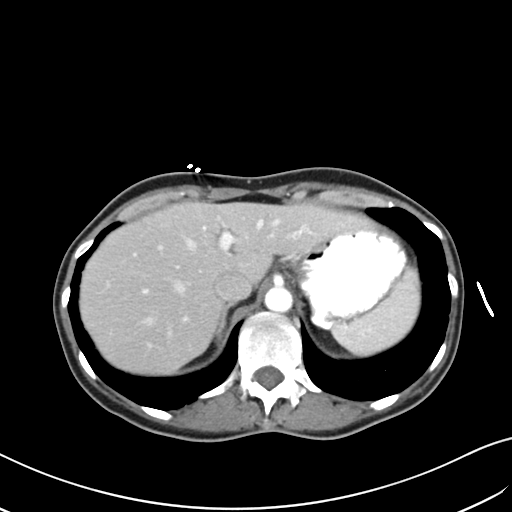
[im 77/82  soft-tissue]
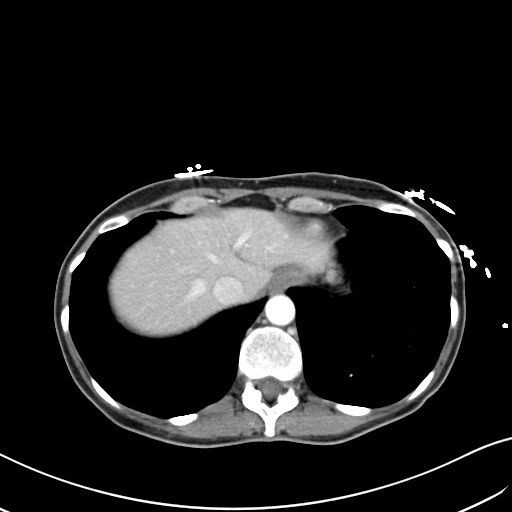

[Series 5: coronal soft tissue · coronal · 0.80mm/px · 3 of 61 slices shown]
[im 21/61  soft-tissue]
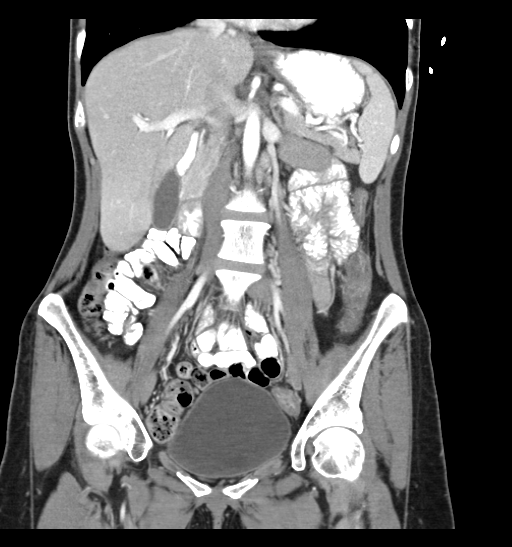
[im 27/61  soft-tissue]
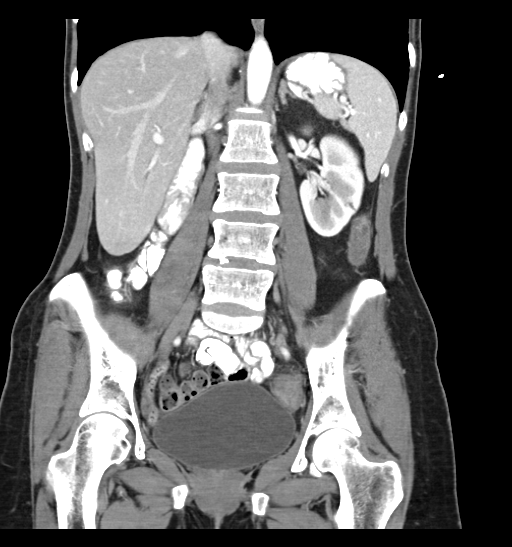
[im 34/61  soft-tissue]
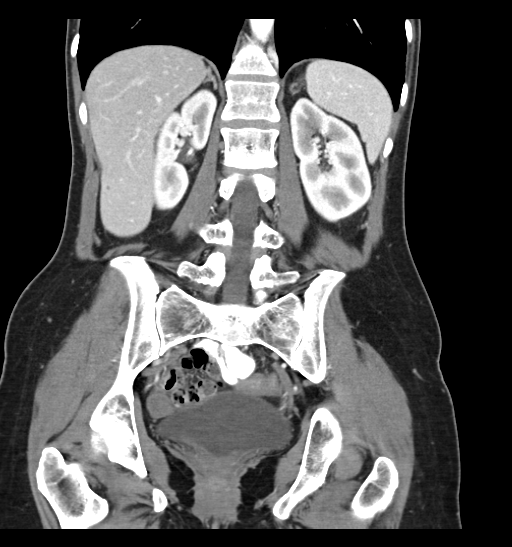

[17 of 46 positions shown; findings below may reference images not displayed]

FINDINGS: Lung bases are free of acute infiltrate or sizable effusion.

The liver, gallbladder, spleen, adrenal glands pancreas are within
normal limits with the exception of a few small hepatic cysts. The
kidneys are well visualized bilaterally without renal calculi or
urinary tract obstructive changes. Delayed images demonstrate normal
excretion of contrast.

The appendix is within normal limits. The uterus and bladder are
unremarkable. No pelvic mass lesion is seen. No significant
lymphadenopathy is noted. The descending colon and rectosigmoid
region are decompressed although some mild edema within the wall is
noted likely representing generalized colitis. This does not appear
to be ischemic in nature as the IMA is patent. Bony structures are
within normal limits.
IMPRESSION: Changes suggestive of colitis in the descending colon and
rectosigmoid.

## 2017-04-04 IMAGING — DX DG CHEST 2V
2 series · 2 of 2 positions shown · non-contrast
Comparison: None.

CLINICAL DATA: Near syncope.  History of mitral valve prolapse

EXAM:
CHEST  2 VIEW

[chest pa]
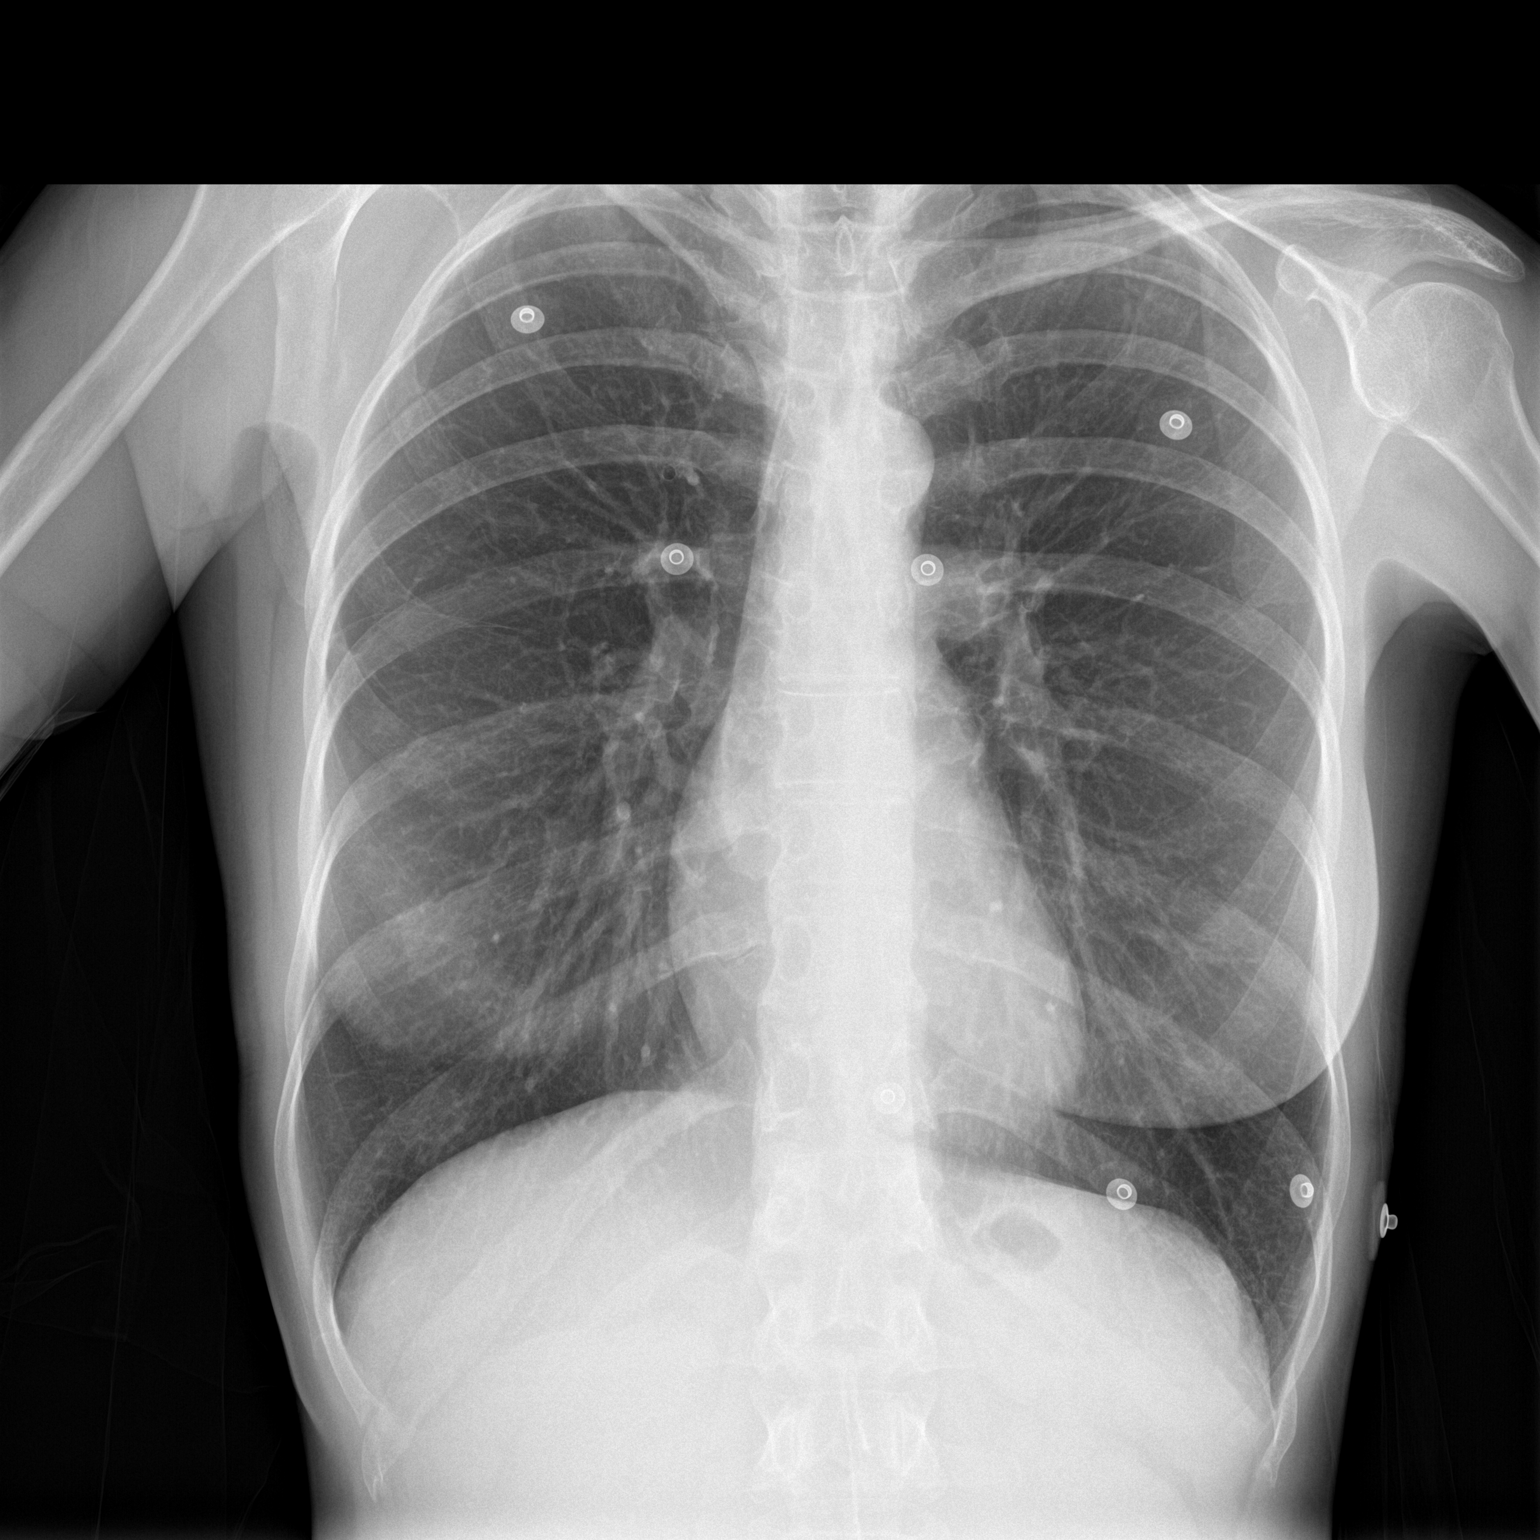

[chest lat]
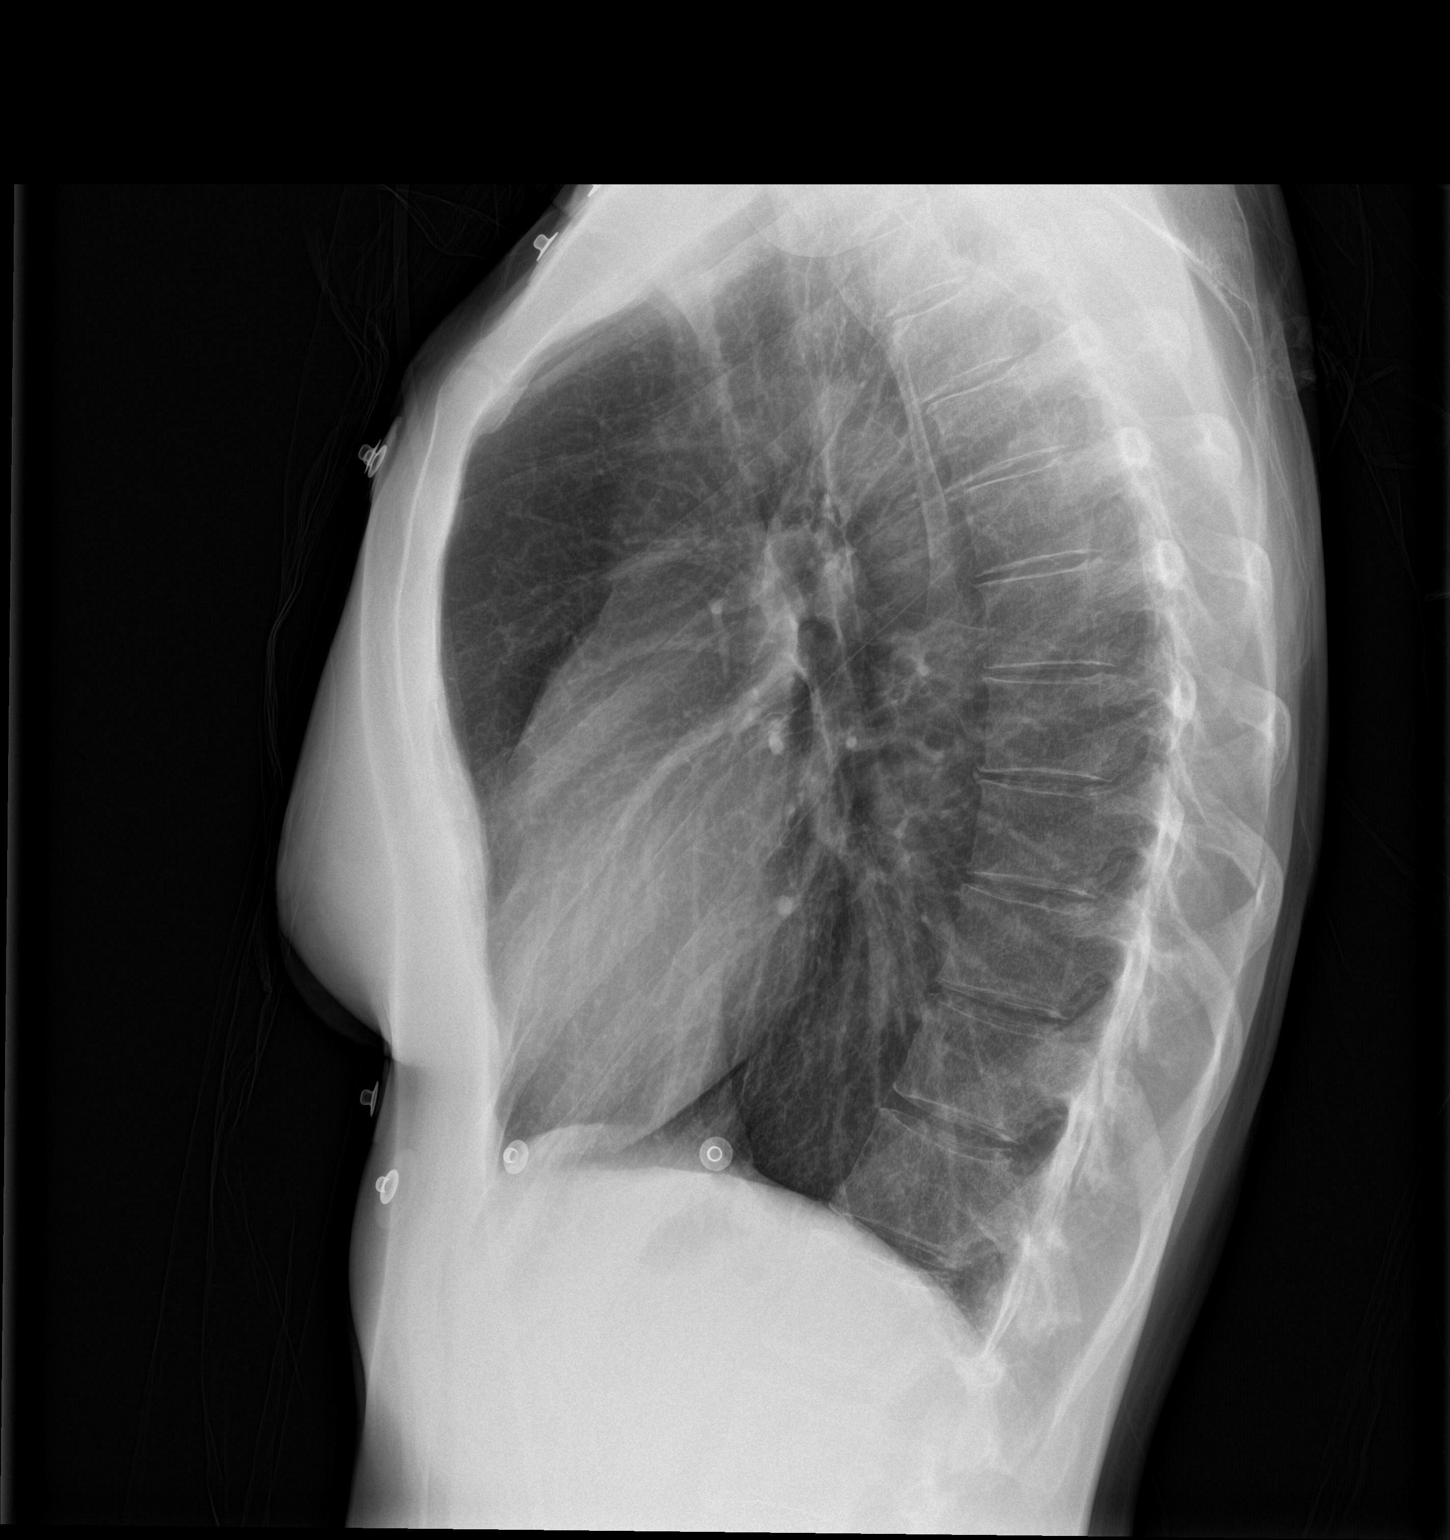

[2 of 2 positions shown; findings below may reference images not displayed]

FINDINGS: Prominent retrosternal clear space but no depression of the
diaphragm. There is no edema, consolidation, effusion, or
pneumothorax. Normal heart size and aortic contours.
IMPRESSION: No active cardiopulmonary disease.

## 2023-09-25 ENCOUNTER — Encounter: Payer: Self-pay | Admitting: Advanced Practice Midwife
